# Patient Record
Sex: Female | Born: 1935 | Race: White | Hispanic: No | Smoking: Never smoker
Health system: Southern US, Community
[De-identification: ages and names within clinical notes are randomized; demographics above are authoritative.]

## PROBLEM LIST (undated history)

## (undated) DIAGNOSIS — M1990 Unspecified osteoarthritis, unspecified site: Secondary | ICD-10-CM

## (undated) DIAGNOSIS — I493 Ventricular premature depolarization: Secondary | ICD-10-CM

## (undated) DIAGNOSIS — J42 Unspecified chronic bronchitis: Secondary | ICD-10-CM

## (undated) DIAGNOSIS — E119 Type 2 diabetes mellitus without complications: Secondary | ICD-10-CM

## (undated) DIAGNOSIS — I2510 Atherosclerotic heart disease of native coronary artery without angina pectoris: Secondary | ICD-10-CM

## (undated) DIAGNOSIS — E785 Hyperlipidemia, unspecified: Secondary | ICD-10-CM

## (undated) DIAGNOSIS — I5032 Chronic diastolic (congestive) heart failure: Secondary | ICD-10-CM

## (undated) DIAGNOSIS — R2681 Unsteadiness on feet: Secondary | ICD-10-CM

## (undated) DIAGNOSIS — IMO0001 Reserved for inherently not codable concepts without codable children: Secondary | ICD-10-CM

## (undated) DIAGNOSIS — Z794 Long term (current) use of insulin: Secondary | ICD-10-CM

## (undated) DIAGNOSIS — E876 Hypokalemia: Secondary | ICD-10-CM

## (undated) DIAGNOSIS — D649 Anemia, unspecified: Secondary | ICD-10-CM

## (undated) DIAGNOSIS — I4891 Unspecified atrial fibrillation: Secondary | ICD-10-CM

## (undated) DIAGNOSIS — I10 Essential (primary) hypertension: Secondary | ICD-10-CM

## (undated) DIAGNOSIS — K219 Gastro-esophageal reflux disease without esophagitis: Secondary | ICD-10-CM

## (undated) DIAGNOSIS — M791 Myalgia, unspecified site: Secondary | ICD-10-CM

## (undated) DIAGNOSIS — R54 Age-related physical debility: Secondary | ICD-10-CM

## (undated) DIAGNOSIS — R609 Edema, unspecified: Secondary | ICD-10-CM

## (undated) HISTORY — DX: Myalgia, unspecified site: M79.10

## (undated) HISTORY — PX: CHOLECYSTECTOMY: SHX55

## (undated) HISTORY — PX: CARDIAC CATHETERIZATION: SHX172

## (undated) HISTORY — DX: Type 2 diabetes mellitus without complications: E11.9

## (undated) HISTORY — PX: ABDOMINAL HYSTERECTOMY: SHX81

## (undated) HISTORY — DX: Edema, unspecified: R60.9

## (undated) HISTORY — DX: Essential (primary) hypertension: I10

## (undated) HISTORY — PX: CORONARY ANGIOPLASTY: SHX604

## (undated) HISTORY — DX: Unspecified osteoarthritis, unspecified site: M19.90

## (undated) HISTORY — PX: OTHER SURGICAL HISTORY: SHX169

## (undated) HISTORY — PX: TONSILLECTOMY: SUR1361

## (undated) HISTORY — DX: Gastro-esophageal reflux disease without esophagitis: K21.9

---

## 2009-08-23 ENCOUNTER — Inpatient Hospital Stay (HOSPITAL_COMMUNITY): Admission: RE | Admit: 2009-08-23 | Discharge: 2009-08-27 | Payer: Self-pay | Admitting: Orthopedic Surgery

## 2010-10-09 LAB — URINE MICROSCOPIC-ADD ON

## 2010-10-09 LAB — GLUCOSE, CAPILLARY
Glucose-Capillary: 126 mg/dL — ABNORMAL HIGH (ref 70–99)
Glucose-Capillary: 129 mg/dL — ABNORMAL HIGH (ref 70–99)
Glucose-Capillary: 129 mg/dL — ABNORMAL HIGH (ref 70–99)
Glucose-Capillary: 134 mg/dL — ABNORMAL HIGH (ref 70–99)
Glucose-Capillary: 145 mg/dL — ABNORMAL HIGH (ref 70–99)
Glucose-Capillary: 146 mg/dL — ABNORMAL HIGH (ref 70–99)
Glucose-Capillary: 151 mg/dL — ABNORMAL HIGH (ref 70–99)
Glucose-Capillary: 154 mg/dL — ABNORMAL HIGH (ref 70–99)
Glucose-Capillary: 155 mg/dL — ABNORMAL HIGH (ref 70–99)
Glucose-Capillary: 163 mg/dL — ABNORMAL HIGH (ref 70–99)
Glucose-Capillary: 99 mg/dL (ref 70–99)

## 2010-10-09 LAB — CBC
MCV: 89.6 fL (ref 78.0–100.0)
RBC: 4.37 MIL/uL (ref 3.87–5.11)
RDW: 13.4 % (ref 11.5–15.5)
WBC: 11.4 10*3/uL — ABNORMAL HIGH (ref 4.0–10.5)

## 2010-10-09 LAB — URINALYSIS, ROUTINE W REFLEX MICROSCOPIC
Ketones, ur: NEGATIVE mg/dL
Nitrite: NEGATIVE
pH: 6 (ref 5.0–8.0)

## 2010-10-09 LAB — COMPREHENSIVE METABOLIC PANEL
AST: 25 U/L (ref 0–37)
Albumin: 3.6 g/dL (ref 3.5–5.2)
CO2: 24 mEq/L (ref 19–32)
Calcium: 10.3 mg/dL (ref 8.4–10.5)
Chloride: 107 mEq/L (ref 96–112)
Creatinine, Ser: 0.57 mg/dL (ref 0.4–1.2)
Sodium: 136 mEq/L (ref 135–145)
Total Bilirubin: 0.5 mg/dL (ref 0.3–1.2)
Total Protein: 6.5 g/dL (ref 6.0–8.3)

## 2010-12-11 IMAGING — CR DG CHEST 2V
2 series · 2 of 2 positions shown · non-contrast
Comparison: None available

CLINICAL DATA: Shortness of breath, humeral fracture

CHEST - 2 VIEW

[view not recorded (1 of 2)]
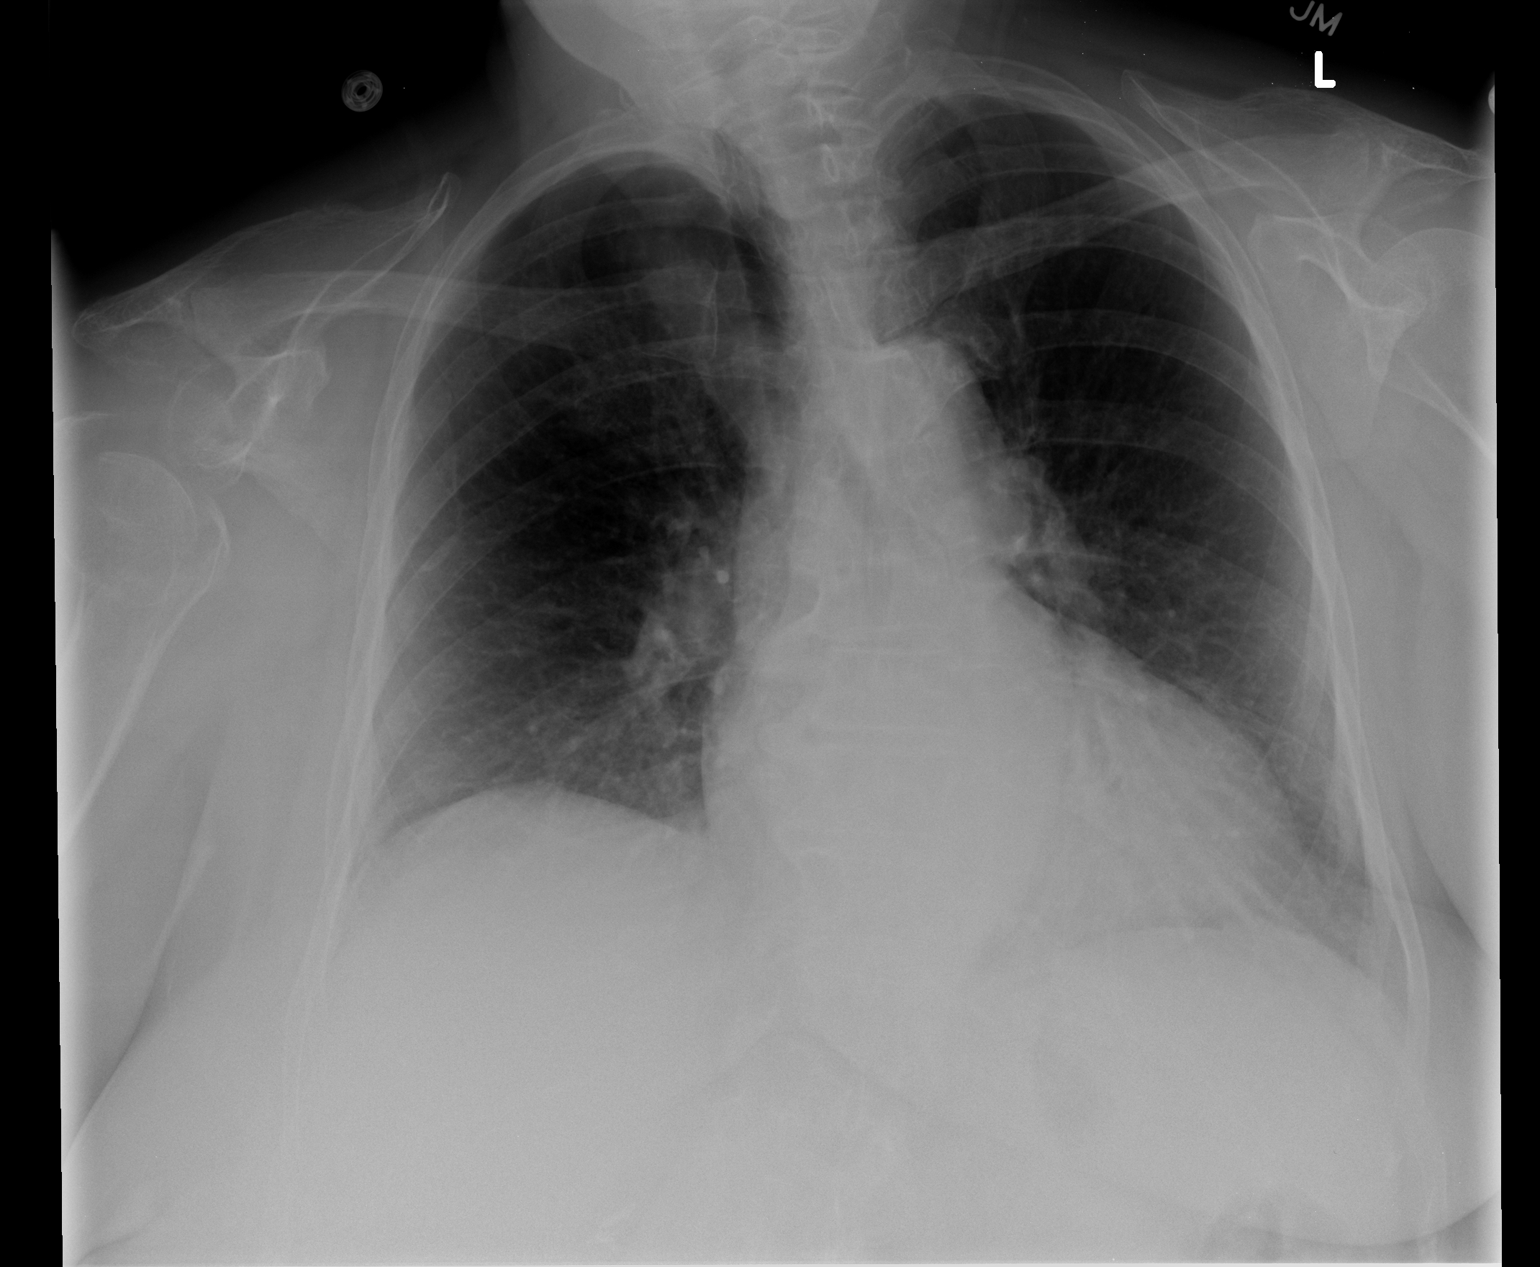

[view not recorded (2 of 2)]
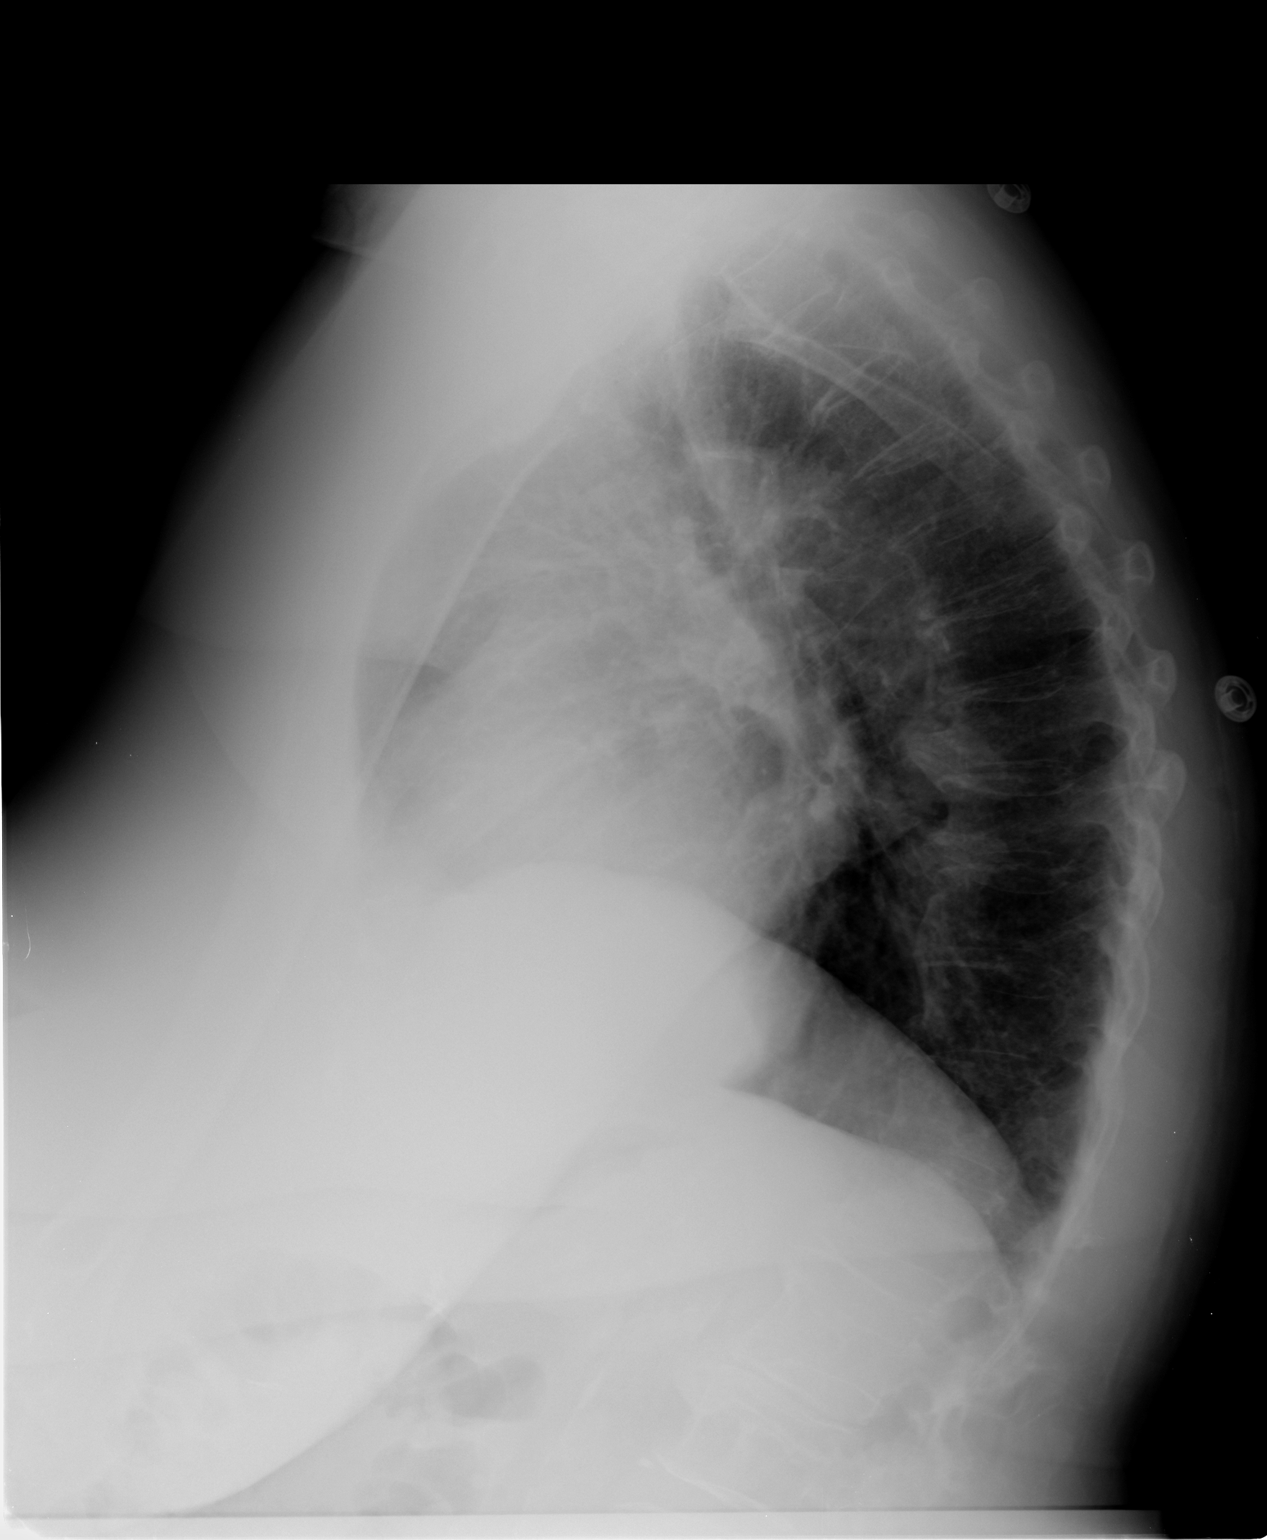

[2 of 2 positions shown; findings below may reference images not displayed]

FINDINGS: Mild cardiomegaly.  Tortuous thoracic aorta.  No
effusion.  Spondylitic changes in the mid thoracic spine.  Lungs
are clear.  Right humeral head fracture with subluxation,
incompletely visualized.
IMPRESSION: 1.  Mild cardiomegaly.

## 2013-09-14 ENCOUNTER — Ambulatory Visit: Payer: Self-pay

## 2013-09-21 ENCOUNTER — Ambulatory Visit (INDEPENDENT_AMBULATORY_CARE_PROVIDER_SITE_OTHER): Payer: Medicare Other

## 2013-09-21 VITALS — BP 145/63 | HR 54 | Resp 16

## 2013-09-21 DIAGNOSIS — M1990 Unspecified osteoarthritis, unspecified site: Secondary | ICD-10-CM

## 2013-09-21 DIAGNOSIS — E1151 Type 2 diabetes mellitus with diabetic peripheral angiopathy without gangrene: Secondary | ICD-10-CM

## 2013-09-21 DIAGNOSIS — M2040 Other hammer toe(s) (acquired), unspecified foot: Secondary | ICD-10-CM

## 2013-09-21 NOTE — Progress Notes (Signed)
   Subjective:    Patient ID: Donna Ballard, female    DOB: 02/19/1931, 80 y.o.   MRN: [id number]  HPI I am a diabetic and have been for about 10 years and I don't think I need my toenails trimmed and I need some new shoes Last pair shoes were ordered nearly 3 years ago she still wearing those there working fairly well however worn need replacing. Patient is no open wounds or ulcerations as a result of wearing her diabetic shoes does have digital contractures history keratoses which are resolved are managed. Patient significant plus one edema nonpalpable pedal pulses at times hyperesthesia.   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  Neurological: Positive for dizziness and headaches.       Burning  Hematological: Bruises/bleeds easily.  All other systems reviewed and are negative.       Objective:   Physical Exam Lower extremity objective findings as follows vascular status is diminished with nonpalpable pulses DP or PT bilateral zero over four temperature is warm to cool turgor diminished there is Refill timed 3-4 seconds all digits there is +1 edema noted bilateral feet and ankles patient is able to walk although has to take breaks in may have some mild early claudication type symptoms. Neurologically epicritic and proprioceptive sensations appear to be intact on Semmes Weinstein testing to forefoot digits and plantar arch. Vibratory sensation intact DTRs not elicited this time. There is normal plantar response noted. Dermatologically skin color pigment normal hair growth absent nails thick brittle criptotic incurvated patient been bring those on her own there is no need for debridement at today's visit no signs of secondary infection no discharge drainage no nail dystrophy noted. There is history keratoses sub-metatarsals result with the use in maintain use of her diabetic insoles and  diabetic shoes. She's had 3 pairs shoes of  over the last several years       Assessment & Plan:  Assessment this time diabetes with peripheral posse minimal neuropathy are significant angiopathy as noted plus one edema severe rigid digital contractures last arthropathy noted with hammertoe deformities HAV deformity these are coming with her current diabetic shoes which are worn need replacing. We'll obtain appropriate authorization from her primary care physician for  Extra-depth shoes and 3 pairs of dual density Plastizote inlays patient to contact with the next month for orthotic scanning or [facility] impressions and measurements for diabetic shoes  Alvan Dameichard Lukasz Rogus DPM

## 2013-09-21 NOTE — Patient Instructions (Signed)
Diabetes and Foot Care Diabetes may cause you to have problems because of poor blood supply (circulation) to your feet and legs. This may cause the skin on your feet to become thinner, break easier, and heal more slowly. Your skin may become dry, and the skin may peel and crack. You may also have nerve damage in your legs and feet causing decreased feeling in them. You may not notice minor injuries to your feet that could lead to infections or more serious problems. Taking care of your feet is one of the most important things you can do for yourself.  HOME CARE INSTRUCTIONS  Wear shoes at all times, even in the house. Do not go barefoot. Bare feet are easily injured.  Check your feet daily for blisters, cuts, and redness. If you cannot see the bottom of your feet, use a mirror or ask someone for help.  Wash your feet with warm water (do not use hot water) and mild soap. Then pat your feet and the areas between your toes until they are completely dry. Do not soak your feet as this can dry your skin.  Apply a moisturizing lotion or petroleum jelly (that does not contain alcohol and is unscented) to the skin on your feet and to dry, brittle toenails. Do not apply lotion between your toes.  Trim your toenails straight across. Do not dig under them or around the cuticle. File the edges of your nails with an emery board or nail file.  Do not cut corns or calluses or try to remove them with medicine.  Wear clean socks or stockings every day. Make sure they are not too tight. Do not wear knee-high stockings since they may decrease blood flow to your legs.  Wear shoes that fit properly and have enough cushioning. To break in new shoes, wear them for just a few hours a day. This prevents you from injuring your feet. Always look in your shoes before you put them on to be sure there are no objects inside.  Do not cross your legs. This may decrease the blood flow to your feet.  If you find a minor scrape,  cut, or break in the skin on your feet, keep it and the skin around it clean and dry. These areas may be cleansed with mild soap and water. Do not cleanse the area with peroxide, alcohol, or iodine.  When you remove an adhesive bandage, be sure not to damage the skin around it.  If you have a wound, look at it several times a day to make sure it is healing.  Do not use heating pads or hot water bottles. They may burn your skin. If you have lost feeling in your feet or legs, you may not know it is happening until it is too late.  Make sure your health care provider performs a complete foot exam at least annually or more often if you have foot problems. Report any cuts, sores, or bruises to your health care provider immediately. SEEK MEDICAL CARE IF:   You have an injury that is not healing.  You have cuts or breaks in the skin.  You have an ingrown nail.  You notice redness on your legs or feet.  You feel burning or tingling in your legs or feet.  You have pain or cramps in your legs and feet.  Your legs or feet are numb.  Your feet always feel cold. SEEK IMMEDIATE MEDICAL CARE IF:   There is increasing redness,   swelling, or pain in or around a wound.  There is a red line that goes up your leg.  Pus is coming from a wound.  You develop a fever or as directed by your health care provider.  You notice a bad smell coming from an ulcer or wound. Document Released: 07/03/2000 Document Revised: 03/08/2013 Document Reviewed: 12/13/2012 [employer] Patient Information 2014 [employer].  

## 2013-10-18 ENCOUNTER — Telehealth: Payer: Self-pay | Admitting: *Deleted

## 2013-10-18 NOTE — Telephone Encounter (Signed)
Brought her there to be fitted for Diabetic Shoes on 09/21/13.  We haven't heard anything.  Is it time to set up an appointment for her to be measured?  She's asking me about it.  Please call.  I called and informed her that her mother does need to be scheduled for an appointment to be measured for Diabetic Shoes.  I sent her to a scheduler for the appointment.

## 2013-10-25 ENCOUNTER — Ambulatory Visit: Payer: Medicare Other

## 2013-11-01 ENCOUNTER — Ambulatory Visit: Payer: Medicare Other

## 2013-11-08 ENCOUNTER — Other Ambulatory Visit: Payer: Medicare Other

## 2013-12-12 ENCOUNTER — Ambulatory Visit (INDEPENDENT_AMBULATORY_CARE_PROVIDER_SITE_OTHER): Payer: Medicare Other | Admitting: *Deleted

## 2013-12-12 DIAGNOSIS — E1159 Type 2 diabetes mellitus with other circulatory complications: Secondary | ICD-10-CM

## 2013-12-12 DIAGNOSIS — I798 Other disorders of arteries, arterioles and capillaries in diseases classified elsewhere: Secondary | ICD-10-CM

## 2013-12-12 DIAGNOSIS — E1151 Type 2 diabetes mellitus with diabetic peripheral angiopathy without gangrene: Secondary | ICD-10-CM

## 2013-12-12 NOTE — Progress Notes (Signed)
I am here to get measured for diabetic shoes

## 2014-02-28 ENCOUNTER — Ambulatory Visit (INDEPENDENT_AMBULATORY_CARE_PROVIDER_SITE_OTHER): Payer: Medicare Other

## 2014-02-28 VITALS — BP 159/59 | HR 59 | Resp 18

## 2014-02-28 DIAGNOSIS — E1159 Type 2 diabetes mellitus with other circulatory complications: Secondary | ICD-10-CM

## 2014-02-28 DIAGNOSIS — E1151 Type 2 diabetes mellitus with diabetic peripheral angiopathy without gangrene: Secondary | ICD-10-CM

## 2014-02-28 DIAGNOSIS — M19079 Primary osteoarthritis, unspecified ankle and foot: Secondary | ICD-10-CM

## 2014-02-28 DIAGNOSIS — M2040 Other hammer toe(s) (acquired), unspecified foot: Secondary | ICD-10-CM

## 2014-02-28 NOTE — Patient Instructions (Signed)
Diabetes and Foot Care Diabetes may cause you to have problems because of poor blood supply (circulation) to your feet and legs. This may cause the skin on your feet to become thinner, break easier, and heal more slowly. Your skin may become dry, and the skin may peel and crack. You may also have nerve damage in your legs and feet causing decreased feeling in them. You may not notice minor injuries to your feet that could lead to infections or more serious problems. Taking care of your feet is one of the most important things you can do for yourself.  HOME CARE INSTRUCTIONS  Wear shoes at all times, even in the house. Do not go barefoot. Bare feet are easily injured.  Check your feet daily for blisters, cuts, and redness. If you cannot see the bottom of your feet, use a mirror or ask someone for help.  Wash your feet with warm water (do not use hot water) and mild soap. Then pat your feet and the areas between your toes until they are completely dry. Do not soak your feet as this can dry your skin.  Apply a moisturizing lotion or petroleum jelly (that does not contain alcohol and is unscented) to the skin on your feet and to dry, brittle toenails. Do not apply lotion between your toes.  Trim your toenails straight across. Do not dig under them or around the cuticle. File the edges of your nails with an emery board or nail file.  Do not cut corns or calluses or try to remove them with medicine.  Wear clean socks or stockings every day. Make sure they are not too tight. Do not wear knee-high stockings since they may decrease blood flow to your legs.  Wear shoes that fit properly and have enough cushioning. To break in new shoes, wear them for just a few hours a day. This prevents you from injuring your feet. Always look in your shoes before you put them on to be sure there are no objects inside.  Do not cross your legs. This may decrease the blood flow to your feet.  If you find a minor scrape,  cut, or break in the skin on your feet, keep it and the skin around it clean and dry. These areas may be cleansed with mild soap and water. Do not cleanse the area with peroxide, alcohol, or iodine.  When you remove an adhesive bandage, be sure not to damage the skin around it.  If you have a wound, look at it several times a day to make sure it is healing.  Do not use heating pads or hot water bottles. They may burn your skin. If you have lost feeling in your feet or legs, you may not know it is happening until it is too late.  Make sure your health care provider performs a complete foot exam at least annually or more often if you have foot problems. Report any cuts, sores, or bruises to your health care provider immediately. SEEK MEDICAL CARE IF:   You have an injury that is not healing.  You have cuts or breaks in the skin.  You have an ingrown nail.  You notice redness on your legs or feet.  You feel burning or tingling in your legs or feet.  You have pain or cramps in your legs and feet.  Your legs or feet are numb.  Your feet always feel cold. SEEK IMMEDIATE MEDICAL CARE IF:   There is increasing redness,   swelling, or pain in or around a wound.  There is a red line that goes up your leg.  Pus is coming from a wound.  You develop a fever or as directed by your health care provider.  You notice a bad smell coming from an ulcer or wound. Document Released: 07/03/2000 Document Revised: 03/08/2013 Document Reviewed: 12/13/2012 [employer] Patient Information 2015 [employer]. This information is not intended to replace advice given to you by your health care provider. Make sure you discuss any questions you have with your health care provider.  

## 2014-02-28 NOTE — Progress Notes (Signed)
Subjective:     Patient ID: Donna Ballard, female   DOB: 09/08/1930, 80 y.o.   MRN: [id number]  HPI i am here to get my diabetic shoes   Review of Systems no new findings or systemic changes     Objective:   Physical Exam Alternate objective findings as follows pedal pulses palpable DP plus one over 4 PT plus one over 4 capillary refill time 3 seconds epicritic and proprioceptive sensations diminished on Semmes Weinstein testing to forefoot and digits. There is normal plantar response DTRs not listed neurologically skin color pigment normal there is keratoses diffuse secondary digital contractures mild arthropathy the foot with gait abnormalities patient's shoes are replaced at this time 1 pair shoes and 3 pairs of dual density Plastizote inlays which fit and contour well to the patient's foot are dispensed with use in break in instructions.    Assessment:     Assessment this time is diabetes with history peripheral neuropathy well-managed at this time does have history of diabetes and complications keratoses and digital contractures had wearing accommodative diabetic shoes inlays preventing breakdown and complicating are placed in shoes are furnished at this time.    Plan:     Plan at this time diabetic shoes are dispensed with break in were instructions followup in 1 year for annual checkup and reevaluation contact us in the interim if there's any exacerbations knee difficulties pain or discomfort or any open wounds or infections were develop next  Alvan Dameichard Avrohom Mckelvin DPM

## 2015-03-20 DIAGNOSIS — I10 Essential (primary) hypertension: Secondary | ICD-10-CM

## 2015-03-20 HISTORY — DX: Essential (primary) hypertension: I10

## 2015-11-04 DIAGNOSIS — Z6841 Body Mass Index (BMI) 40.0 and over, adult: Secondary | ICD-10-CM

## 2015-11-04 DIAGNOSIS — Z79899 Other long term (current) drug therapy: Secondary | ICD-10-CM | POA: Insufficient documentation

## 2015-11-04 HISTORY — DX: Other long term (current) drug therapy: Z79.899

## 2015-11-04 HISTORY — DX: Body Mass Index (BMI) 40.0 and over, adult: Z684

## 2016-03-02 DIAGNOSIS — Z6841 Body Mass Index (BMI) 40.0 and over, adult: Secondary | ICD-10-CM | POA: Insufficient documentation

## 2016-03-02 HISTORY — DX: Morbid (severe) obesity due to excess calories: E66.01

## 2016-03-02 HISTORY — DX: Body Mass Index (BMI) 40.0 and over, adult: Z684

## 2018-04-29 DIAGNOSIS — K2960 Other gastritis without bleeding: Secondary | ICD-10-CM | POA: Insufficient documentation

## 2018-04-29 HISTORY — DX: Other gastritis without bleeding: K29.60

## 2018-06-12 ENCOUNTER — Inpatient Hospital Stay (HOSPITAL_COMMUNITY)
Admission: AD | Admit: 2018-06-12 | Discharge: 2018-06-15 | DRG: 246 | Disposition: A | Discharge status: Skilled Nursing Facility | Payer: Medicare Other | Source: Other Acute Inpatient Hospital | Attending: Internal Medicine | Admitting: Internal Medicine

## 2018-06-12 ENCOUNTER — Inpatient Hospital Stay (HOSPITAL_COMMUNITY): Payer: Medicare Other

## 2018-06-12 ENCOUNTER — Encounter (HOSPITAL_COMMUNITY): Payer: Self-pay | Admitting: Internal Medicine

## 2018-06-12 DIAGNOSIS — I249 Acute ischemic heart disease, unspecified: Secondary | ICD-10-CM | POA: Diagnosis not present

## 2018-06-12 DIAGNOSIS — Z7982 Long term (current) use of aspirin: Secondary | ICD-10-CM | POA: Diagnosis not present

## 2018-06-12 DIAGNOSIS — I5032 Chronic diastolic (congestive) heart failure: Secondary | ICD-10-CM

## 2018-06-12 DIAGNOSIS — R269 Unspecified abnormalities of gait and mobility: Secondary | ICD-10-CM | POA: Diagnosis present

## 2018-06-12 DIAGNOSIS — E669 Obesity, unspecified: Secondary | ICD-10-CM | POA: Diagnosis present

## 2018-06-12 DIAGNOSIS — R7989 Other specified abnormal findings of blood chemistry: Secondary | ICD-10-CM

## 2018-06-12 DIAGNOSIS — E785 Hyperlipidemia, unspecified: Secondary | ICD-10-CM | POA: Diagnosis not present

## 2018-06-12 DIAGNOSIS — Z7901 Long term (current) use of anticoagulants: Secondary | ICD-10-CM

## 2018-06-12 DIAGNOSIS — S0083XA Contusion of other part of head, initial encounter: Secondary | ICD-10-CM | POA: Diagnosis not present

## 2018-06-12 DIAGNOSIS — D72829 Elevated white blood cell count, unspecified: Secondary | ICD-10-CM

## 2018-06-12 DIAGNOSIS — E876 Hypokalemia: Secondary | ICD-10-CM

## 2018-06-12 DIAGNOSIS — I4891 Unspecified atrial fibrillation: Principal | ICD-10-CM | POA: Diagnosis present

## 2018-06-12 DIAGNOSIS — J9601 Acute respiratory failure with hypoxia: Secondary | ICD-10-CM

## 2018-06-12 DIAGNOSIS — I5033 Acute on chronic diastolic (congestive) heart failure: Secondary | ICD-10-CM | POA: Diagnosis present

## 2018-06-12 DIAGNOSIS — Z6841 Body Mass Index (BMI) 40.0 and over, adult: Secondary | ICD-10-CM | POA: Diagnosis not present

## 2018-06-12 DIAGNOSIS — E119 Type 2 diabetes mellitus without complications: Secondary | ICD-10-CM | POA: Diagnosis present

## 2018-06-12 DIAGNOSIS — I119 Hypertensive heart disease without heart failure: Secondary | ICD-10-CM

## 2018-06-12 DIAGNOSIS — I509 Heart failure, unspecified: Secondary | ICD-10-CM

## 2018-06-12 DIAGNOSIS — R54 Age-related physical debility: Secondary | ICD-10-CM | POA: Diagnosis present

## 2018-06-12 DIAGNOSIS — R0602 Shortness of breath: Secondary | ICD-10-CM

## 2018-06-12 DIAGNOSIS — I110 Hypertensive heart disease with heart failure: Secondary | ICD-10-CM | POA: Diagnosis present

## 2018-06-12 DIAGNOSIS — I5031 Acute diastolic (congestive) heart failure: Secondary | ICD-10-CM | POA: Diagnosis not present

## 2018-06-12 DIAGNOSIS — D649 Anemia, unspecified: Secondary | ICD-10-CM | POA: Diagnosis not present

## 2018-06-12 DIAGNOSIS — W19XXXA Unspecified fall, initial encounter: Secondary | ICD-10-CM | POA: Diagnosis not present

## 2018-06-12 DIAGNOSIS — M25551 Pain in right hip: Secondary | ICD-10-CM | POA: Diagnosis not present

## 2018-06-12 DIAGNOSIS — I2510 Atherosclerotic heart disease of native coronary artery without angina pectoris: Secondary | ICD-10-CM | POA: Diagnosis present

## 2018-06-12 DIAGNOSIS — I370 Nonrheumatic pulmonary valve stenosis: Secondary | ICD-10-CM | POA: Diagnosis not present

## 2018-06-12 DIAGNOSIS — Z96611 Presence of right artificial shoulder joint: Secondary | ICD-10-CM | POA: Diagnosis present

## 2018-06-12 DIAGNOSIS — I214 Non-ST elevation (NSTEMI) myocardial infarction: Secondary | ICD-10-CM | POA: Diagnosis not present

## 2018-06-12 DIAGNOSIS — R55 Syncope and collapse: Secondary | ICD-10-CM | POA: Diagnosis present

## 2018-06-12 DIAGNOSIS — R2681 Unsteadiness on feet: Secondary | ICD-10-CM

## 2018-06-12 DIAGNOSIS — Z888 Allergy status to other drugs, medicaments and biological substances status: Secondary | ICD-10-CM

## 2018-06-12 DIAGNOSIS — I4819 Other persistent atrial fibrillation: Secondary | ICD-10-CM

## 2018-06-12 DIAGNOSIS — I10 Essential (primary) hypertension: Secondary | ICD-10-CM

## 2018-06-12 DIAGNOSIS — I25110 Atherosclerotic heart disease of native coronary artery with unstable angina pectoris: Secondary | ICD-10-CM

## 2018-06-12 HISTORY — DX: Essential (primary) hypertension: I10

## 2018-06-12 HISTORY — DX: Ventricular premature depolarization: I49.3

## 2018-06-12 HISTORY — DX: Reserved for inherently not codable concepts without codable children: IMO0001

## 2018-06-12 HISTORY — DX: Unspecified chronic bronchitis: J42

## 2018-06-12 HISTORY — DX: Unsteadiness on feet: R26.81

## 2018-06-12 HISTORY — DX: Long term (current) use of insulin: Z79.4

## 2018-06-12 HISTORY — DX: Unspecified osteoarthritis, unspecified site: M19.90

## 2018-06-12 HISTORY — DX: Atherosclerotic heart disease of native coronary artery without angina pectoris: I25.10

## 2018-06-12 HISTORY — DX: Hypokalemia: E87.6

## 2018-06-12 HISTORY — DX: Type 2 diabetes mellitus without complications: E11.9

## 2018-06-12 HISTORY — DX: Anemia, unspecified: D64.9

## 2018-06-12 HISTORY — DX: Unspecified atrial fibrillation: I48.91

## 2018-06-12 HISTORY — DX: Chronic diastolic (congestive) heart failure: I50.32

## 2018-06-12 HISTORY — DX: Age-related physical debility: R54

## 2018-06-12 HISTORY — DX: Non-ST elevation (NSTEMI) myocardial infarction: I21.4

## 2018-06-12 HISTORY — DX: Hyperlipidemia, unspecified: E78.5

## 2018-06-12 LAB — APTT: aPTT: 55 seconds — ABNORMAL HIGH (ref 24–36)

## 2018-06-12 LAB — CBC
HCT: 36.8 % (ref 36.0–46.0)
Hemoglobin: 11.2 g/dL — ABNORMAL LOW (ref 12.0–15.0)
MCH: 26.5 pg (ref 26.0–34.0)
MCHC: 30.4 g/dL (ref 30.0–36.0)
MCV: 87 fL (ref 80.0–100.0)
Platelets: 292 10*3/uL (ref 150–400)
RBC: 4.23 MIL/uL (ref 3.87–5.11)
RDW: 14.6 % (ref 11.5–15.5)
WBC: 19.5 10*3/uL — AB (ref 4.0–10.5)
nRBC: 0 % (ref 0.0–0.2)

## 2018-06-12 LAB — PROTIME-INR
INR: 1.17
PROTHROMBIN TIME: 14.8 s (ref 11.4–15.2)

## 2018-06-12 LAB — MRSA PCR SCREENING: MRSA by PCR: NEGATIVE

## 2018-06-12 LAB — GLUCOSE, CAPILLARY: GLUCOSE-CAPILLARY: 144 mg/dL — AB (ref 70–99)

## 2018-06-12 MED ORDER — INSULIN GLARGINE 100 UNIT/ML ~~LOC~~ SOLN
20.0000 [IU] | Freq: Every day | SUBCUTANEOUS | Status: DC
  Administered 2018-06-13 – 2018-06-15 (×3): 20 [IU] via SUBCUTANEOUS
  Filled 2018-06-12 (×3): qty 0.2

## 2018-06-12 MED ORDER — ADULT MULTIVITAMIN W/MINERALS CH
1.0000 | ORAL_TABLET | Freq: Every day | ORAL | Status: DC
  Administered 2018-06-13 – 2018-06-15 (×3): 1 via ORAL
  Filled 2018-06-12 (×3): qty 1

## 2018-06-12 MED ORDER — FUROSEMIDE 10 MG/ML IJ SOLN
20.0000 mg | Freq: Once | INTRAMUSCULAR | Status: AC
  Administered 2018-06-12: 20 mg via INTRAVENOUS
  Filled 2018-06-12: qty 2

## 2018-06-12 MED ORDER — ASPIRIN 81 MG PO CHEW
81.0000 mg | CHEWABLE_TABLET | Freq: Every day | ORAL | Status: DC
  Administered 2018-06-13 – 2018-06-14 (×2): 81 mg via ORAL
  Filled 2018-06-12 (×2): qty 1

## 2018-06-12 MED ORDER — ATORVASTATIN CALCIUM 40 MG PO TABS
40.0000 mg | ORAL_TABLET | Freq: Every day | ORAL | Status: DC
  Administered 2018-06-14: 40 mg via ORAL
  Filled 2018-06-12: qty 1

## 2018-06-12 MED ORDER — ALBUTEROL SULFATE (2.5 MG/3ML) 0.083% IN NEBU: 3.0000 mL | INHALATION_SOLUTION | Freq: Four times a day (QID) | RESPIRATORY_TRACT | Status: DC | PRN

## 2018-06-12 MED ORDER — TRAMADOL HCL 50 MG PO TABS
50.0000 mg | ORAL_TABLET | Freq: Two times a day (BID) | ORAL | Status: DC | PRN
  Administered 2018-06-12 – 2018-06-13 (×2): 50 mg via ORAL
  Filled 2018-06-12 (×2): qty 1

## 2018-06-12 MED ORDER — POLYETHYLENE GLYCOL 3350 17 G PO PACK
17.0000 g | PACK | Freq: Every day | ORAL | Status: DC
  Administered 2018-06-13 – 2018-06-15 (×2): 17 g via ORAL
  Filled 2018-06-12 (×3): qty 1

## 2018-06-12 MED ORDER — HEPARIN (PORCINE) 25000 UT/250ML-% IV SOLN
1150.0000 [IU]/h | INTRAVENOUS | Status: DC
  Administered 2018-06-12: 1000 [IU]/h via INTRAVENOUS

## 2018-06-12 MED ORDER — FUROSEMIDE 10 MG/ML IJ SOLN
20.0000 mg | Freq: Once | INTRAMUSCULAR | Status: AC
  Administered 2018-06-13: 20 mg via INTRAVENOUS
  Filled 2018-06-12: qty 2

## 2018-06-12 MED ORDER — NITROGLYCERIN 0.4 MG SL SUBL: 0.4000 mg | SUBLINGUAL_TABLET | SUBLINGUAL | Status: DC | PRN

## 2018-06-12 MED ORDER — ACETAMINOPHEN 325 MG PO TABS
650.0000 mg | ORAL_TABLET | ORAL | Status: DC | PRN
  Administered 2018-06-14: 650 mg via ORAL
  Filled 2018-06-12: qty 2

## 2018-06-12 MED ORDER — CARVEDILOL 25 MG PO TABS
25.0000 mg | ORAL_TABLET | Freq: Two times a day (BID) | ORAL | Status: DC
  Administered 2018-06-13 – 2018-06-15 (×4): 25 mg via ORAL
  Filled 2018-06-12 (×4): qty 1

## 2018-06-12 MED ORDER — FOLIC ACID 1 MG PO TABS
500.0000 ug | ORAL_TABLET | Freq: Every day | ORAL | Status: DC
  Administered 2018-06-13 – 2018-06-15 (×3): 0.5 mg via ORAL
  Filled 2018-06-12 (×3): qty 1

## 2018-06-12 MED ORDER — ORAL CARE MOUTH RINSE
15.0000 mL | Freq: Two times a day (BID) | OROMUCOSAL | Status: DC
  Administered 2018-06-12 – 2018-06-15 (×6): 15 mL via OROMUCOSAL

## 2018-06-12 MED ORDER — FAMOTIDINE 20 MG PO TABS
20.0000 mg | ORAL_TABLET | Freq: Every day | ORAL | Status: DC
  Administered 2018-06-13 – 2018-06-15 (×3): 20 mg via ORAL
  Filled 2018-06-12 (×3): qty 1

## 2018-06-12 MED ORDER — POLYVINYL ALCOHOL 1.4 % OP SOLN
1.0000 [drp] | Freq: Two times a day (BID) | OPHTHALMIC | Status: DC
  Administered 2018-06-13 – 2018-06-15 (×6): 1 [drp] via OPHTHALMIC
  Filled 2018-06-12: qty 15

## 2018-06-12 MED ORDER — ONDANSETRON HCL 4 MG/2ML IJ SOLN: 4.0000 mg | Freq: Four times a day (QID) | INTRAMUSCULAR | Status: DC | PRN

## 2018-06-12 MED ORDER — VITAMIN B-12 100 MCG PO TABS
500.0000 ug | ORAL_TABLET | Freq: Every day | ORAL | Status: DC
  Administered 2018-06-13 – 2018-06-15 (×3): 500 ug via ORAL
  Filled 2018-06-12 (×2): qty 1
  Filled 2018-06-12: qty 5

## 2018-06-12 NOTE — Progress Notes (Signed)
ANTICOAGULATION CONSULT NOTE - Initial Consult  Pharmacy Consult for Heparin  Indication: atrial fibrillation, NSTEMI  Allergies  Allergen Reactions  . Amlodipine Shortness Of Breath  . Metformin And Related Shortness Of Breath  . Celecoxib Diarrhea  . Enalapril Maleate Other (See Comments)    Myalgias   . Estrogens Other (See Comments)    Edema  . Furosemide     Myalgia   . Hydrochlorothiazide Other (See Comments)    Hypercalcemia  . Irbesartan Other (See Comments)    Leg cramps   . Pantoprazole Other (See Comments)    Arthralgia   . Trandolapril Other (See Comments)    Insomnia   . Valsartan Other (See Comments)    Myalgia, rash, insomnia    Patient Measurements: ~96 kg  Vital Signs: BP: 136/80 (11/24 2300) Pulse Rate: 94 (11/24 2300)   Medical History: Past Medical History:  Diagnosis Date  . CHF (congestive heart failure) (HCC)   . Chronic bronchitis (HCC)   . HLD (hyperlipidemia)   . HTN (hypertension)   . Insulin dependent diabetes mellitus (HCC)   . Osteoarthritis    Assessment: 84 y/o F [facility] with NSTEMI and new onset atrial fibrillation. Likely cath 11/25. Pt arrived [facility] with heparin infusing at 1000 units/hr. Hgb there was 10.6.   Goal of Therapy:  Heparin level 0.3-0.7 units/ml Monitor platelets by anticoagulation protocol: Yes   Plan:  -Cont heparin at 1000 units/hr -Check heparin level with AM labs  Abran DukeLedford, Diella Gillingham 06/12/2018,11:40 PM

## 2018-06-12 NOTE — H&P (Signed)
CARDIOLOGY H&P  HPI: Donna Ballard is a 84 y.o. female w/ history of obesity, HTN, DM who presents with presycnope and fall.   In brief, the patient lives by herself and has had a longstanding history of difficulty with her balance requiring a walker.  She has had multiple episodes in the past where she has become dizzy and almost fallen, however has never overtly fallen and injured herself.  She states that she was at home yesterday and got out of bed to go to the bathroom.  After standing up from using the bathroom she became very dizzy and lightheaded.  She attempted to reach her walker but was unable to do so and ended up falling onto the ground.  She does not believe that she struck her head or significantly injured any other part of her body in the fall.  She vehemently denies any prodromal symptoms of chest pain, chest pressure, acute shortness of breath, palpitations before her fall.  EMS was called and found the patient to be hypoxemic at home.  She was brought to an outside hospital for further evaluation.  On arrival to the outside hospital, the patient was started on BiPAP for her hypoxemia.  She was found to be in rapid atrial fibrillation, which was a new diagnosis for her at the time.  She was found to have a white blood cell count of 13.7, potassium 2.4, and calcium of 6.2.  Her BNP was elevated and 1930.  Her initial troponin was negative.  She was given 80 mg of IV Lasix and admitted for further management.  She put out over 2 L of urine in response to 1 dose of IV Lasix.  She was weaned off of BiPAP and placed on nasal cannula.  Her second troponin came back elevated at 0.57.  She was started on heparin for acute coronary syndrome and transferred to [facility] for further evaluation.  Prior to transfer, the patient underwent chest CT scan, which revealed nonspecific groundglass opacities in the bilateral lungs suggestive of pulmonary edema and at least moderate coronary and  aortic atherosclerosis.  She further was found to have small bilateral pleural effusions and bibasilar atelectasis.  A head CT was performed and revealed no evidence of acute intracranial abnormality.  She was found to have a small forehead hematoma without fracture.  A right hip x-ray was performed as well which showed advanced lumbar degenerative spondylosis with no gross displaced fracture or misalignment.  On arrival to [facility], the patient denies any new symptoms.  She predominantly describes symptoms of exertional shortness of breath and orthopnea that are been going on over the past several weeks.  She is in atrial fibrillation but she does not feel any palpitations, chest pressure, or chest pain.  She has had worsening bilateral extremity edema over the past several weeks as well.   Review of Systems:     Cardiac Review of Systems: {Y] = yes [ ]  = no  Chest Pain [    ]  Resting SOB [   ] Exertional SOB  [Y]  Orthopnea [Y]   Pedal Edema [Y]    Palpitations [Y] Syncope  [  ]   Presyncope [Y]  General Review of Systems: [Y] = yes [  ]=no Constitional: recent weight change [  ]; anorexia [  ]; fatigue [  ]; nausea [  ]; night sweats [  ]; fever [  ]; or chills [  ];  Dental: poor dentition[  ];   Eye : blurred vision [  ]; diplopia [   ]; vision changes [  ];  Amaurosis fugax[  ]; Resp: cough [  ];  wheezing[  ];  hemoptysis[  ]; shortness of breath[Y]; paroxysmal nocturnal dyspnea[Y]; dyspnea on exertion[Y]; or orthopnea[Y];  GI:  gallstones[  ], vomiting[  ];  dysphagia[  ]; melena[  ];  hematochezia [  ]; heartburn[  ];   GU: kidney stones [  ]; hematuria[  ];   dysuria [  ];  nocturia[  ];               Skin: rash [Y], swelling[  ];, hair loss[  ];  peripheral edema[Y];  or itching[  ]; Musculosketetal: myalgias[Y];  joint swelling[  ];  joint erythema[  ];  joint pain[Y];  back pain[  ];  Heme/Lymph:  bruising[Y];  bleeding[  ];  anemia[  ];  Neuro: TIA[  ];  headaches[  ];  stroke[  ];  vertigo[  ];  seizures[  ];   paresthesias[  ];  difficulty walking[  ];  Psych:depression[  ]; anxiety[  ];  Endocrine: diabetes[Y];  thyroid dysfunction[  ];  Other:  No past medical history on file.  Prior to Admission medications   Medication Sig Start Date End Date Taking? Authorizing Provider  albuterol (PROVENTIL HFA;VENTOLIN HFA) 108 (90 Base) MCG/ACT inhaler Inhale 2 puffs into the lungs every 6 (six) hours as needed for wheezing or shortness of breath.   Yes [provider]  aspirin 81 MG chewable tablet Chew 81 mg by mouth daily.   Yes [provider]  carvedilol (COREG) 25 MG tablet Take 25 mg by mouth 2 (two) times daily with a meal.   Yes [provider]  ergocalciferol (VITAMIN D2) 1.25 MG (50000 UT) capsule Take 50,000 Units by mouth once a week.   Yes [provider]  folic acid (FOLVITE) 800 MCG tablet Take 400 mcg by mouth daily.   Yes [provider]  insulin glargine (LANTUS) 100 UNIT/ML injection Inject 20 Units into the skin daily.    Yes [provider]  Multiple Vitamin (MULTIVITAMIN) capsule Take 1 capsule by mouth daily.   Yes [provider]  nitroGLYCERIN (NITROSTAT) 0.4 MG SL tablet Place 0.4 mg under the tongue every 5 (five) minutes as needed for chest pain.   Yes [provider]  Omega-3 Fatty Acids (FISH OIL) 1200 MG CAPS Take 1 capsule by mouth daily.   Yes [provider]  polyethylene glycol (MIRALAX / GLYCOLAX) packet Take 17 g by mouth daily.   Yes [provider]  Propylene Glycol (SYSTANE BALANCE OP) Apply 1 drop to eye 2 (two) times daily.   Yes [provider]  ranitidine (ZANTAC) 150 MG tablet Take 150 mg by mouth 2 (two) times daily.   Yes [provider]  torsemide (DEMADEX) 20 MG tablet Take 20 mg by mouth daily.   Yes [provider]  traMADol  (ULTRAM) 50 MG tablet Take 50 mg by mouth 2 (two) times daily as needed.   Yes [provider]  vitamin B-12 (CYANOCOBALAMIN) 500 MCG tablet Take 500 mcg by mouth daily.   Yes [provider]      Allergies not on file  Social History   Socioeconomic History  . Marital status: Widowed    Spouse name: Not on file  . Number of children: Not on file  . Years  of education: Not on file  . Highest education level: Not on file  Occupational History  . Not on file  Social Needs  . Financial resource strain: Not on file  . Food insecurity:    Worry: Not on file    Inability: Not on file  . Transportation needs:    Medical: Not on file    Non-medical: Not on file  Tobacco Use  . Smoking status: Not on file  Substance and Sexual Activity  . Alcohol use: Not on file  . Drug use: Not on file  . Sexual activity: Not on file  Lifestyle  . Physical activity:    Days per week: Not on file    Minutes per session: Not on file  . Stress: Not on file  Relationships  . Social connections:    Talks on phone: Not on file    Gets together: Not on file    Attends religious service: Not on file    Active member of club or organization: Not on file    Attends meetings of clubs or organizations: Not on file    Relationship status: Not on file  . Intimate partner violence:    Fear of current or ex partner: Not on file    Emotionally abused: Not on file    Physically abused: Not on file    Forced sexual activity: Not on file  Other Topics Concern  . Not on file  Social History Narrative  . Not on file    No family history on file.  PHYSICAL EXAM: Vitals:   06/12/18 2137 06/12/18 2145  BP: (!) 151/68 (!) 152/78  Pulse: 92 98  Resp: (!) 21 20  SpO2: 97% 98%   General: Frail-appearing, pleasant, multiple areas of bruising, no acute distress HEENT: Bruising throughout the forehead and bilateral periorbital areas with some swelling over the forehead as well Neck:  supple.  JVP elevated to approximately 10 cm water. Carotids 2+ bilat; no bruits. No lymphadenopathy or thryomegaly appreciated. Cor: PMI nondisplaced.  Irregularly irregular rhythm. No rubs, gallops or murmurs.  Distant heart sounds Lungs: Crackles heard most predominantly at the left lung base, decreased air movement throughout Abdomen: soft, nontender, nondistended. No hepatosplenomegaly. No bruits or masses. Good bowel sounds. Extremities: no cyanosis, clubbing, rash, 2+ pitting edema of the bilateral lower extremities extending proximal to the knee and symmetric Neuro: alert & oriented x 3, cranial nerves grossly intact. moves all 4 extremities w/o difficulty. Affect pleasant.  ECG: Heart rate 84 bpm in atrial fibrillation, right bundle branch block, repolarization abnormality  Results for orders placed or performed during the hospital encounter of 06/12/18 (from the past 24 hour(s))  Glucose, capillary     Status: Abnormal   Collection Time: 06/12/18  9:45 PM  Result Value Ref Range   Glucose-Capillary 144 (H) 70 - 99 mg/dL   No results found.  ASSESSMENT: Donna Ballard is a 84 y.o. female w/ history of obesity, HTN, DM who presents with presycnope and fall, found to have new atrial fibrillation with RVR and non-STEMI.  [facility] outside hospital for coronary angiography.  Regarding the patient's non-STEMI, she has multiple risk factors for coronary artery disease.  Whether this was a type I event secondary to unstable plaque rupture or a demand mediated event in the setting of physiologic stress and new atrial fibrillation is unclear.  We will proceed with invasive coronary angiography tomorrow.  The patient also appears to be  in decompensated heart failure, which is a new diagnosis for her.  On some of her past records there is a diagnosis of heart failure listed, however no further information about this is available.  It is certainly possible that  her new onset atrial fibrillation has provoked clinically decompensated heart failure.  We currently do not have an assessment of her LV systolic function.  We will treat with IV diuretics overnight tonight and plan to assess her LV function with echocardiogram tomorrow.  She will need medical optimization if she is found to have reduced ejection fraction heart failure.  PLAN/DISCUSSION: #) NSTEMI - repeat troponin q6h x 2 - NPO for cath in AM - check lipids, A1c - ASA 81mg  daily - heparin drip for AF per pharmacy protocol - atorvastatin 40mg  QHS - SLN, nitro gtt PRN - defer P2Y12 until after cath  #) New onset AF - heparin drip as per above - Likely transition to oral anticoagulant tomorrow - May be worth considering electrical cardioversion in attempt to restore sinus rhythm pending the remainder of the patient's work-up; patient would notably need TEE prior to cardioversion attempt  #) Decompensated heart failure: Patient has responded robustly to low-dose IV Lasix boluses - 20 mg IV Lasix tonight and tomorrow morning - Patient does not appear to be too far off from euvolemia, may be ready to transition to oral diuretic in the next 1 to 2 days - Echocardiogram as mentioned above - Will need medical optimization for treatment of heart failure pending results from echocardiogram  #) Falls, frailty - physical therapy consult - Patient will likely need inpatient rehabilitation once she is medically cleared for discharge  Rosario Jacks, MD Cardiology Fellow, PGY-6

## 2018-06-13 ENCOUNTER — Other Ambulatory Visit: Payer: Self-pay

## 2018-06-13 ENCOUNTER — Encounter (HOSPITAL_COMMUNITY)
Admission: AD | Disposition: A | Discharge status: Skilled Nursing Facility | Payer: Self-pay | Source: Other Acute Inpatient Hospital | Attending: Internal Medicine

## 2018-06-13 DIAGNOSIS — I214 Non-ST elevation (NSTEMI) myocardial infarction: Secondary | ICD-10-CM

## 2018-06-13 DIAGNOSIS — I2510 Atherosclerotic heart disease of native coronary artery without angina pectoris: Secondary | ICD-10-CM

## 2018-06-13 DIAGNOSIS — I4819 Other persistent atrial fibrillation: Secondary | ICD-10-CM

## 2018-06-13 HISTORY — PX: CORONARY STENT INTERVENTION: CATH118234

## 2018-06-13 HISTORY — PX: LEFT HEART CATH AND CORONARY ANGIOGRAPHY: CATH118249

## 2018-06-13 LAB — BASIC METABOLIC PANEL
ANION GAP: 12 (ref 5–15)
BUN: 14 mg/dL (ref 8–23)
CHLORIDE: 103 mmol/L (ref 98–111)
CO2: 25 mmol/L (ref 22–32)
Calcium: 9 mg/dL (ref 8.9–10.3)
Creatinine, Ser: 0.89 mg/dL (ref 0.44–1.00)
GFR calc Af Amer: >60 mL/min (ref >60)
GFR calc non Af Amer: 57 mL/min — ABNORMAL LOW (ref >60)
Glucose, Bld: 205 mg/dL — ABNORMAL HIGH (ref 70–99)
Potassium: 3.3 mmol/L — ABNORMAL LOW (ref 3.5–5.1)
SODIUM: 140 mmol/L (ref 135–145)

## 2018-06-13 LAB — CBC
HCT: 34.3 % — ABNORMAL LOW (ref 36.0–46.0)
HEMOGLOBIN: 10.2 g/dL — AB (ref 12.0–15.0)
MCH: 25.8 pg — ABNORMAL LOW (ref 26.0–34.0)
MCHC: 29.7 g/dL — ABNORMAL LOW (ref 30.0–36.0)
MCV: 86.6 fL (ref 80.0–100.0)
Platelets: 263 10*3/uL (ref 150–400)
RBC: 3.96 MIL/uL (ref 3.87–5.11)
RDW: 14.9 % (ref 11.5–15.5)
WBC: 18.2 10*3/uL — AB (ref 4.0–10.5)
nRBC: 0 % (ref 0.0–0.2)

## 2018-06-13 LAB — COMPREHENSIVE METABOLIC PANEL
ALBUMIN: 3.5 g/dL (ref 3.5–5.0)
ALK PHOS: 62 U/L (ref 38–126)
ALT: 16 U/L (ref 0–44)
AST: 26 U/L (ref 15–41)
Anion gap: 14 (ref 5–15)
BILIRUBIN TOTAL: 1.5 mg/dL — AB (ref 0.3–1.2)
BUN: 12 mg/dL (ref 8–23)
CALCIUM: 9.2 mg/dL (ref 8.9–10.3)
CO2: 21 mmol/L — AB (ref 22–32)
Chloride: 102 mmol/L (ref 98–111)
Creatinine, Ser: 0.83 mg/dL (ref 0.44–1.00)
GFR calc Af Amer: >60 mL/min (ref >60)
GFR calc non Af Amer: >60 mL/min (ref >60)
Glucose, Bld: 165 mg/dL — ABNORMAL HIGH (ref 70–99)
POTASSIUM: 3.5 mmol/L (ref 3.5–5.1)
SODIUM: 137 mmol/L (ref 135–145)
TOTAL PROTEIN: 6.9 g/dL (ref 6.5–8.1)

## 2018-06-13 LAB — TROPONIN I
TROPONIN I: 0.91 ng/mL — AB (ref <0.03)
Troponin I: 1.08 ng/mL (ref <0.03)

## 2018-06-13 LAB — HEPARIN LEVEL (UNFRACTIONATED)
HEPARIN UNFRACTIONATED: 0.26 [IU]/mL — AB (ref 0.30–0.70)
HEPARIN UNFRACTIONATED: 0.55 [IU]/mL (ref 0.30–0.70)

## 2018-06-13 LAB — GLUCOSE, CAPILLARY
GLUCOSE-CAPILLARY: 161 mg/dL — AB (ref 70–99)
GLUCOSE-CAPILLARY: 237 mg/dL — AB (ref 70–99)
Glucose-Capillary: 152 mg/dL — ABNORMAL HIGH (ref 70–99)
Glucose-Capillary: 108 mg/dL — ABNORMAL HIGH (ref 70–99)

## 2018-06-13 LAB — LACTIC ACID, PLASMA
Lactic Acid, Venous: 1.9 mmol/L (ref 0.5–1.9)
Lactic Acid, Venous: 1.6 mmol/L (ref 0.5–1.9)

## 2018-06-13 LAB — BRAIN NATRIURETIC PEPTIDE: B NATRIURETIC PEPTIDE 5: 541.7 pg/mL — AB (ref 0.0–100.0)

## 2018-06-13 SURGERY — LEFT HEART CATH AND CORONARY ANGIOGRAPHY
Anesthesia: LOCAL

## 2018-06-13 MED ORDER — CLOPIDOGREL BISULFATE 75 MG PO TABS
75.0000 mg | ORAL_TABLET | Freq: Every day | ORAL | Status: DC
  Administered 2018-06-14 – 2018-06-15 (×2): 75 mg via ORAL
  Filled 2018-06-13 (×2): qty 1

## 2018-06-13 MED ORDER — POTASSIUM CHLORIDE CRYS ER 20 MEQ PO TBCR
40.0000 meq | EXTENDED_RELEASE_TABLET | Freq: Once | ORAL | Status: AC
  Administered 2018-06-13: 40 meq via ORAL
  Filled 2018-06-13: qty 2

## 2018-06-13 MED ORDER — FUROSEMIDE 10 MG/ML IJ SOLN
40.0000 mg | Freq: Two times a day (BID) | INTRAMUSCULAR | Status: DC
  Administered 2018-06-13: 40 mg via INTRAVENOUS
  Filled 2018-06-13: qty 4

## 2018-06-13 MED ORDER — HEPARIN (PORCINE) IN NACL 1000-0.9 UT/500ML-% IV SOLN
INTRAVENOUS | Status: DC | PRN
  Administered 2018-06-13: 500 mL

## 2018-06-13 MED ORDER — IOHEXOL 350 MG/ML SOLN
INTRAVENOUS | Status: DC | PRN
  Administered 2018-06-13: 95 mL via INTRAVENOUS

## 2018-06-13 MED ORDER — LIDOCAINE HCL (PF) 1 % IJ SOLN
INTRAMUSCULAR | Status: DC | PRN
  Administered 2018-06-13: 2 mL

## 2018-06-13 MED ORDER — SODIUM CHLORIDE 0.9% FLUSH: 3.0000 mL | INTRAVENOUS | Status: DC | PRN

## 2018-06-13 MED ORDER — NITROGLYCERIN 1 MG/10 ML FOR IR/CATH LAB
INTRA_ARTERIAL | Status: AC
  Filled 2018-06-13: qty 10

## 2018-06-13 MED ORDER — SODIUM CHLORIDE 0.9 % WEIGHT BASED INFUSION
3.0000 mL/kg/h | INTRAVENOUS | Status: DC
  Administered 2018-06-13: 0.104 mL/kg/h via INTRAVENOUS

## 2018-06-13 MED ORDER — POTASSIUM CHLORIDE 20 MEQ PO PACK: 40.0000 meq | PACK | Freq: Once | ORAL | Status: DC

## 2018-06-13 MED ORDER — CLOPIDOGREL BISULFATE 300 MG PO TABS
ORAL_TABLET | ORAL | Status: DC | PRN
  Administered 2018-06-13: 600 mg via ORAL

## 2018-06-13 MED ORDER — SODIUM CHLORIDE 0.9 % IV SOLN: 250.0000 mL | INTRAVENOUS | Status: DC | PRN

## 2018-06-13 MED ORDER — CLOPIDOGREL BISULFATE 300 MG PO TABS
ORAL_TABLET | ORAL | Status: AC
  Filled 2018-06-13: qty 2

## 2018-06-13 MED ORDER — HYDRALAZINE HCL 20 MG/ML IJ SOLN: 5.0000 mg | INTRAMUSCULAR | Status: AC | PRN

## 2018-06-13 MED ORDER — HEPARIN SODIUM (PORCINE) 1000 UNIT/ML IJ SOLN
INTRAMUSCULAR | Status: DC | PRN
  Administered 2018-06-13: 2000 [IU] via INTRAVENOUS
  Administered 2018-06-13: 5000 [IU] via INTRAVENOUS
  Administered 2018-06-13: 4500 [IU] via INTRAVENOUS

## 2018-06-13 MED ORDER — ASPIRIN 81 MG PO CHEW: 81.0000 mg | CHEWABLE_TABLET | ORAL | Status: DC

## 2018-06-13 MED ORDER — NITROGLYCERIN 1 MG/10 ML FOR IR/CATH LAB
INTRA_ARTERIAL | Status: DC | PRN
  Administered 2018-06-13: 200 ug

## 2018-06-13 MED ORDER — HEPARIN (PORCINE) IN NACL 1000-0.9 UT/500ML-% IV SOLN
INTRAVENOUS | Status: AC
  Filled 2018-06-13: qty 1000

## 2018-06-13 MED ORDER — POTASSIUM CHLORIDE CRYS ER 20 MEQ PO TBCR
20.0000 meq | EXTENDED_RELEASE_TABLET | Freq: Two times a day (BID) | ORAL | Status: DC
  Administered 2018-06-13 – 2018-06-14 (×2): 20 meq via ORAL
  Filled 2018-06-13 (×2): qty 1

## 2018-06-13 MED ORDER — VERAPAMIL HCL 2.5 MG/ML IV SOLN
INTRAVENOUS | Status: DC | PRN
  Administered 2018-06-13: 18:00:00 via INTRA_ARTERIAL

## 2018-06-13 MED ORDER — VERAPAMIL HCL 2.5 MG/ML IV SOLN
INTRAVENOUS | Status: AC
  Filled 2018-06-13: qty 2

## 2018-06-13 MED ORDER — LABETALOL HCL 5 MG/ML IV SOLN: 10.0000 mg | INTRAVENOUS | Status: AC | PRN

## 2018-06-13 MED ORDER — SODIUM CHLORIDE 0.9 % WEIGHT BASED INFUSION: 3.0000 mL/kg/h | INTRAVENOUS | Status: DC

## 2018-06-13 MED ORDER — SODIUM CHLORIDE 0.9% FLUSH
3.0000 mL | Freq: Two times a day (BID) | INTRAVENOUS | Status: DC
  Administered 2018-06-13: 3 mL via INTRAVENOUS

## 2018-06-13 MED ORDER — SODIUM CHLORIDE 0.9% FLUSH
3.0000 mL | Freq: Two times a day (BID) | INTRAVENOUS | Status: DC
  Administered 2018-06-13 – 2018-06-15 (×3): 3 mL via INTRAVENOUS

## 2018-06-13 MED ORDER — HEPARIN (PORCINE) 25000 UT/250ML-% IV SOLN
1150.0000 [IU]/h | INTRAVENOUS | Status: DC
  Administered 2018-06-14: 1150 [IU]/h via INTRAVENOUS
  Filled 2018-06-13: qty 250

## 2018-06-13 MED ORDER — LIDOCAINE HCL (PF) 1 % IJ SOLN
INTRAMUSCULAR | Status: AC
  Filled 2018-06-13: qty 30

## 2018-06-13 MED ORDER — FENTANYL CITRATE (PF) 100 MCG/2ML IJ SOLN
INTRAMUSCULAR | Status: AC
  Filled 2018-06-13: qty 2

## 2018-06-13 SURGICAL SUPPLY — 18 items
BALLN EMERGE MR 2.5X8 (BALLOONS) ×2
BALLN SAPPHIRE ~~LOC~~ 3.25X8 (BALLOONS) ×2 IMPLANT
BALLOON EMERGE MR 2.5X8 (BALLOONS) ×1 IMPLANT
CATH INFINITI 5FR ANG PIGTAIL (CATHETERS) ×2 IMPLANT
CATH OPTITORQUE TIG 4.0 5F (CATHETERS) ×2 IMPLANT
CATH VISTA GUIDE 6FR XBLAD3.0 (CATHETERS) ×2 IMPLANT
DEVICE RAD COMP TR BAND LRG (VASCULAR PRODUCTS) ×2 IMPLANT
GLIDESHEATH SLEND A-KIT 6F 22G (SHEATH) ×2 IMPLANT
GUIDEWIRE INQWIRE 1.5J.035X260 (WIRE) ×1 IMPLANT
INQWIRE 1.5J .035X260CM (WIRE) ×2
KIT ENCORE 26 ADVANTAGE (KITS) ×2 IMPLANT
KIT HEART LEFT (KITS) ×2 IMPLANT
PACK CARDIAC CATHETERIZATION (CUSTOM PROCEDURE TRAY) ×2 IMPLANT
SHEATH PROBE COVER 6X72 (BAG) ×2 IMPLANT
STENT RESOLUTE ONYX 3.0X12 (Permanent Stent) ×2 IMPLANT
TRANSDUCER W/STOPCOCK (MISCELLANEOUS) ×2 IMPLANT
TUBING CIL FLEX 10 FLL-RA (TUBING) ×2 IMPLANT
WIRE RUNTHROUGH .014X180CM (WIRE) ×2 IMPLANT

## 2018-06-13 NOTE — Plan of Care (Signed)
°  Problem: Education: °Goal: Knowledge of General Education information will improve °Description: Including pain rating scale, medication(s)/side effects and non-pharmacologic comfort measures °Outcome: Progressing °  °Problem: Clinical Measurements: °Goal: Will remain free from infection °Outcome: Progressing °Goal: Respiratory complications will improve °Outcome: Progressing °  °Problem: Coping: °Goal: Level of anxiety will decrease °Outcome: Progressing °  °Problem: Safety: °Goal: Ability to remain free from injury will improve °Outcome: Progressing °  °Problem: Skin Integrity: °Goal: Risk for impaired skin integrity will decrease °Outcome: Progressing °  °

## 2018-06-13 NOTE — Progress Notes (Signed)
[facility]  Date: 06/13/18 Time: 5:26 PM   Patient is scheduled for left heart catheterization due to elevated troponin and fall.  She is currently DNR status.  I have discussed the procedure, including risks and benefits with Donna Ballard and her family.  Donna Ballard has agreed to rescind her DNR/DNI order during the catheterization.  Yvonne Kendallhristopher Nahum Sherrer, MD [facility] Pager: [phone]

## 2018-06-13 NOTE — Progress Notes (Signed)
ANTICOAGULATION CONSULT NOTE  Pharmacy Consult for IV heparin Indication: atrial fibrillation  Allergies  Allergen Reactions  . Amlodipine Shortness Of Breath  . Metformin And Related Shortness Of Breath  . Celecoxib Diarrhea  . Enalapril Maleate Other (See Comments)    Myalgias   . Estrogens Other (See Comments)    Edema  . Furosemide     Myalgia   . Hydrochlorothiazide Other (See Comments)    Hypercalcemia  . Irbesartan Other (See Comments)    Leg cramps   . Lescol [Fluvastatin] Other (See Comments)    myalgia  . Losartan Other (See Comments)    confusion  . Pantoprazole Other (See Comments)    Arthralgia   . Trandolapril Other (See Comments)    Insomnia   . Valsartan Other (See Comments)    Myalgia, rash, insomnia    Patient Measurements: Height: 5\' 1"  (154.9 cm) Weight: 211 lb 6.7 oz (95.9 kg) IBW/kg (Calculated) : 47.8 Heparin Dosing Weight: 70.6 kg  Vital Signs: Temp: 97.7 F (36.5 C) (11/25 1200) Temp Source: Oral (11/25 1200) BP: 120/56 (11/25 1300) Pulse Rate: 84 (11/25 1300)  Labs: Recent Labs    06/12/18 2303 06/13/18 0258 06/13/18 1214  HGB 11.2* 10.2*  --   HCT 36.8 34.3*  --   PLT 292 263  --   APTT 55*  --   --   LABPROT 14.8  --   --   INR 1.17  --   --   HEPARINUNFRC  --  0.26* 0.55  CREATININE 0.83 0.89  --   TROPONINI 0.91* 1.08*  --     Estimated Creatinine Clearance: 48.9 mL/min (by C-G formula based on SCr of 0.89 mg/dL).   Medical History: Past Medical History:  Diagnosis Date  . CHF (congestive heart failure) (HCC)   . Chronic bronchitis (HCC)   . HLD (hyperlipidemia)   . HTN (hypertension)   . Insulin dependent diabetes mellitus (HCC)   . Osteoarthritis     Medications:  Infusions:  . sodium chloride    . [START ON 06/14/2018] sodium chloride    . heparin 1,150 Units/hr (06/13/18 1300)    Assessment: 84 [facility] F [facility] with NSTEMI and new onset atrial fibrillation. Likely cath  11/25. Heparin level at goal currently.  CBC fairly stable, no overt bleeding or complications noted.  Goal of Therapy:  Heparin level 0.3-0.7 units/ml Monitor platelets by anticoagulation protocol: Yes   Plan:  1. Continue IV heparin at current rate. 2. Daily heparin level and CBC. 3. F/u plans for anticoagulation after cath today - could we start Eliquis tonight?  (5 mg BID).  Jenetta DownerJessica Kmari Brian, Pharm D, BCPS, [facility] Clinical Pharmacist Phone [phone]  06/13/2018 1:33 PM

## 2018-06-13 NOTE — Progress Notes (Signed)
Progress Note  Patient Name: Donna Ballard Date of Encounter: 06/13/2018  Primary Cardiologist:   New, Tasha Jindra   Subjective   84 year old female with a history of hypertension, obesity, diabetes mellitus who is admitted with presyncope and a fall. Is in May but has had lots of problems with balance.  She walks with a walker.  Yesterday she was getting up from the toilet and became very dizzy and lightheaded.  EMS was called.  They found her to be hypoxic and she was taken to an outside hospital.  She was placed on BiPAP for hypoxemia.  She was found to have rapid atrial fibrillation which was new. She was given given IV Lasix with a good urine output.  She was transferred to [facility] for heart catheterization. Troponin is 1.08 this morning. Blood cell count is 18.2.  She is afebrile.  Inpatient Medications    Scheduled Meds: . aspirin  81 mg Oral Daily  . atorvastatin  40 mg Oral q1800  . carvedilol  25 mg Oral BID WC  . famotidine  20 mg Oral Daily  . folic acid  500 mcg Oral Daily  . insulin glargine  20 Units Subcutaneous Daily  . mouth rinse  15 mL Mouth Rinse BID  . multivitamin with minerals  1 tablet Oral Daily  . polyethylene glycol  17 g Oral Daily  . polyvinyl alcohol  1 drop Both Eyes BID  . vitamin B-12  500 mcg Oral Daily   Continuous Infusions: . heparin 1,150 Units/hr (06/13/18 0800)   PRN Meds: acetaminophen, albuterol, nitroGLYCERIN, ondansetron (ZOFRAN) IV, traMADol   Vital Signs    Vitals:   06/13/18 0600 06/13/18 0700 06/13/18 0736 06/13/18 0800  BP: (!) 159/71 (!) 157/80  134/61  Pulse: 85 79  72  Resp: (!) 22 19  15   Temp:   97.7 F (36.5 C)   TempSrc:   Oral   SpO2: 97% 97%  97%  Weight:      Height:        Intake/Output Summary (Last 24 hours) at 06/13/2018 0825 Last data filed at 06/13/2018 0800 Gross per 24 hour  Intake 169.71 ml  Output 575 ml  Net -405.29 ml   Filed Weights   06/13/18 0500  Weight: 95.9 kg     Telemetry    Atrial fib with controlled V response  - Personally Reviewed  ECG     atrial fib - Personally Reviewed  Physical Exam   GEN:  Elderly female, no acute distress Neck: No JVD Cardiac:  Irregularly irregular Respiratory: Rales and scant wheezes. GI: Soft, nontender, non-distended  MS: No edema; No deformity. Neuro:  Nonfocal  Psych: Normal affect   Labs    Chemistry Recent Labs  Lab 06/12/18 2303 06/13/18 0258  NA 137 140  K 3.5 3.3*  CL 102 103  CO2 21* 25  GLUCOSE 165* 205*  BUN 12 14  CREATININE 0.83 0.89  CALCIUM 9.2 9.0  PROT 6.9  --   ALBUMIN 3.5  --   AST 26  --   ALT 16  --   ALKPHOS 62  --   BILITOT 1.5*  --   GFRNONAA >60 57*  GFRAA >60 >60  ANIONGAP 14 12     Hematology Recent Labs  Lab 06/12/18 2303 06/13/18 0258  WBC 19.5* 18.2*  RBC 4.23 3.96  HGB 11.2* 10.2*  HCT 36.8 34.3*  MCV 87.0 86.6  MCH 26.5 25.8*  MCHC 30.4 29.7*  RDW 14.6 14.9  PLT 292 263    Cardiac Enzymes Recent Labs  Lab 06/12/18 2303 06/13/18 0258  TROPONINI 0.91* 1.08*   No results for input(s): TROPIPOC in the last 168 hours.   BNP Recent Labs  Lab 06/12/18 2303  BNP 541.7*     DDimer No results for input(s): DDIMER in the last 168 hours.   Radiology    Dg Chest Port 1 View  Result Date: 06/12/2018 CLINICAL DATA:  Shortness of breath.  Fall this morning at home. EXAM: PORTABLE CHEST 1 VIEW COMPARISON:  [facility]adi[facility] and chest CT [facility] FINDINGS: Improving pulmonary edema. Mild cardiomegaly is unchanged. Aortic atherosclerosis. Pleural effusions on CT are not as well seen radiographically. No new focal airspace disease. No pneumothorax. Right shoulder arthroplasty again seen. IMPRESSION: Improving pulmonary edema since radiograph earlier this day. Pleural effusions on chest CT are not well seen radiographically. Aortic Atherosclerosis (ICD10-I70.0). Electronically Signed   By: Narda RutherfordMelanie  Sanford M.D.   On:  06/12/2018 23:37    Cardiac Studies     Patient Profile     [age] y.o. female with new diagnosis of atrial fib,  Admitted with syncopal episode after getting up from the toilet   Assessment & Plan    1.  Atrial fibrillation:  CHADS2VASC is   535  ( female, age [age],  HTN, DM) Her heart rate is better controlled now.  She is not having any chest pain in her breathing is much better.  We will anticipate doing a heart catheterization today.  We will start her on Eliquis today or tomorrow.  2.   Acute CHF:   Likely exacerbated by her rapid atrial fib Echo has been ordered.     Continue lasix for now  3.  NSTEMI:   For cardiac cath today  I've discussed the risks, benefits, options.  He understands and agrees to proceed.    For questions or updates, please contact [facility] Please consult [email or web address] for contact info under     Signed, Kristeen MissPhilip Damarko Stitely, MD  06/13/2018, 8:25 AM

## 2018-06-13 NOTE — Interval H&P Note (Signed)
History and Physical Interval Note:  06/13/2018 5:23 PM  Donna Ballard  has presented today for cardiac catheterization, with the diagnosis of NSTEMI. The various methods of treatment have been discussed with the patient and family. After consideration of risks, benefits and other options for treatment, the patient has consented to  Procedure(s): LEFT HEART CATH AND CORONARY ANGIOGRAPHY (N/A) as a surgical intervention .  The patient's history has been reviewed, patient examined, no change in status, stable for surgery.  I have reviewed the patient's chart and labs.  Questions were answered to the patient's satisfaction.    Cath Lab Visit (complete for each Cath Lab visit)  Clinical Evaluation Leading to the Procedure:   ACS: Yes.    Non-ACS:  N/A  [name]

## 2018-06-13 NOTE — Progress Notes (Signed)
Recent K drawn at 0258 = 3.3  Paged Dr. Liane Comberarnicelli and notified him  Pt is resting, asymptomatic who remains in a-fib and being diuresed w/IV lasix  Occasional PVCs  Pt A&O x4; in no acute distress  New orders: 40 mEq of potassium

## 2018-06-13 NOTE — Progress Notes (Signed)
ANTICOAGULATION CONSULT NOTE   Pharmacy Consult for Heparin  Indication: atrial fibrillation, NSTEMI, s/p cath  Allergies  Allergen Reactions  . Amlodipine Shortness Of Breath  . Metformin And Related Shortness Of Breath  . Celecoxib Diarrhea  . Enalapril Maleate Other (See Comments)    Myalgias   . Estrogens Other (See Comments)    Edema  . Furosemide     Myalgia   . Hydrochlorothiazide Other (See Comments)    Hypercalcemia  . Irbesartan Other (See Comments)    Leg cramps   . Lescol [Fluvastatin] Other (See Comments)    myalgia  . Losartan Other (See Comments)    confusion  . Pantoprazole Other (See Comments)    Arthralgia   . Trandolapril Other (See Comments)    Insomnia   . Valsartan Other (See Comments)    Myalgia, rash, insomnia    Patient Measurements: ~96 kg  Vital Signs: Temp: 98.3 F (36.8 C) (11/25 2000) Temp Source: Oral (11/25 2000) BP: 106/56 (11/25 2230) Pulse Rate: 62 (11/25 2245)   Medical History: Past Medical History:  Diagnosis Date  . CHF (congestive heart failure) (HCC)   . Chronic bronchitis (HCC)   . HLD (hyperlipidemia)   . HTN (hypertension)   . Insulin dependent diabetes mellitus (HCC)   . Osteoarthritis    Assessment: 84 [facility] F [facility] with NSTEM[facility] and new onset atrial fibrillation. Likely cath 11/25. [facility] heparin infusing at 1000 units/hr. Hgb there was 10.6.   11/25 PM update: now s/p cath with plans to resume heparin 2 hours post TR band removal (done at 2330 today).   Goal of Therapy:  Heparin level 0.3-0.7 units/ml Monitor platelets by anticoagulation protocol: Yes   Plan:  -Resume heparin at 1150 units/hr at 0130 on 11/26 -Heparin level on 0930 on 11/26  Abran DukeLedford, Jaila Schellhorn 06/13/2018,11:42 PM

## 2018-06-13 NOTE — Progress Notes (Signed)
Pt's cbg at 06/13/18 at 0000 = 237  Paged Cards fellow (Dr. Liane Comberarnicelli) to see if he wants to put her on a sliding scale and how often he wants to check her blood glucose.   Sts cbg is fine and we do not need to check pt's CBG at the moment. Ok to start pt's Lantus tomorrow eve and will not place her on a sliding scale at the moment.

## 2018-06-13 NOTE — H&P (View-Only) (Signed)
Progress Note  Patient Name: Donna Ballard Date of Encounter: 06/13/2018  Primary Cardiologist:   New, Jvion Turgeon   Subjective   84 year old female with a history of hypertension, obesity, diabetes mellitus who is admitted with presyncope and a fall. Is in May but has had lots of problems with balance.  She walks with a walker.  Yesterday she was getting up from the toilet and became very dizzy and lightheaded.  EMS was called.  They found her to be hypoxic and she was taken to an outside hospital.  She was placed on BiPAP for hypoxemia.  She was found to have rapid atrial fibrillation which was new. She was given given IV Lasix with a good urine output.  She was transferred to [facility] for heart catheterization. Troponin is 1.08 this morning. Blood cell count is 18.2.  She is afebrile.  Inpatient Medications    Scheduled Meds: . aspirin  81 mg Oral Daily  . atorvastatin  40 mg Oral q1800  . carvedilol  25 mg Oral BID WC  . famotidine  20 mg Oral Daily  . folic acid  500 mcg Oral Daily  . insulin glargine  20 Units Subcutaneous Daily  . mouth rinse  15 mL Mouth Rinse BID  . multivitamin with minerals  1 tablet Oral Daily  . polyethylene glycol  17 g Oral Daily  . polyvinyl alcohol  1 drop Both Eyes BID  . vitamin B-12  500 mcg Oral Daily   Continuous Infusions: . heparin 1,150 Units/hr (06/13/18 0800)   PRN Meds: acetaminophen, albuterol, nitroGLYCERIN, ondansetron (ZOFRAN) IV, traMADol   Vital Signs    Vitals:   06/13/18 0600 06/13/18 0700 06/13/18 0736 06/13/18 0800  BP: (!) 159/71 (!) 157/80  134/61  Pulse: 85 79  72  Resp: (!) 22 19  15   Temp:   97.7 F (36.5 C)   TempSrc:   Oral   SpO2: 97% 97%  97%  Weight:      Height:        Intake/Output Summary (Last 24 hours) at 06/13/2018 0825 Last data filed at 06/13/2018 0800 Gross per 24 hour  Intake 169.71 ml  Output 575 ml  Net -405.29 ml   Filed Weights   06/13/18 0500  Weight: 95.9 kg     Telemetry    Atrial fib with controlled V response  - Personally Reviewed  ECG     atrial fib - Personally Reviewed  Physical Exam   GEN:  Elderly female, no acute distress Neck: No JVD Cardiac:  Irregularly irregular Respiratory: Rales and scant wheezes. GI: Soft, nontender, non-distended  MS: No edema; No deformity. Neuro:  Nonfocal  Psych: Normal affect   Labs    Chemistry Recent Labs  Lab 06/12/18 2303 06/13/18 0258  NA 137 140  K 3.5 3.3*  CL 102 103  CO2 21* 25  GLUCOSE 165* 205*  BUN 12 14  CREATININE 0.83 0.89  CALCIUM 9.2 9.0  PROT 6.9  --   ALBUMIN 3.5  --   AST 26  --   ALT 16  --   ALKPHOS 62  --   BILITOT 1.5*  --   GFRNONAA >60 57*  GFRAA >60 >60  ANIONGAP 14 12     Hematology Recent Labs  Lab 06/12/18 2303 06/13/18 0258  WBC 19.5* 18.2*  RBC 4.23 3.96  HGB 11.2* 10.2*  HCT 36.8 34.3*  MCV 87.0 86.6  MCH 26.5 25.8*  MCHC 30.4 29.7*  RDW 14.6 14.9  PLT 292 263    Cardiac Enzymes Recent Labs  Lab 06/12/18 2303 06/13/18 0258  TROPONINI 0.91* 1.08*   No results for input(s): TROPIPOC in the last 168 hours.   BNP Recent Labs  Lab 06/12/18 2303  BNP 541.7*     DDimer No results for input(s): DDIMER in the last 168 hours.   Radiology    Dg Chest Port 1 View  Result Date: 06/12/2018 CLINICAL DATA:  Shortness of breath.  Fall this morning at home. EXAM: PORTABLE CHEST 1 VIEW COMPARISON:  Radiographs and chest CT earlier [facility]. FINDINGS: Improving pulmonary edema. Mild cardiomegaly is unchanged. Aortic atherosclerosis. Pleural effusions on CT are not as well seen radiographically. No new focal airspace disease. No pneumothorax. Right shoulder arthroplasty again seen. IMPRESSION: Improving pulmonary edema since radiograph earlier this day. Pleural effusions on chest CT are not well seen radiographically. Aortic Atherosclerosis (ICD10-I70.0). Electronically Signed   By: Narda RutherfordMelanie  Sanford M.D.   On:  06/12/2018 23:37    Cardiac Studies     Patient Profile     [age] y.o. female with new diagnosis of atrial fib,  Admitted with syncopal episode after getting up from the toilet   Assessment & Plan    1.  Atrial fibrillation:  CHADS2VASC is   535  ( female, age [age],  HTN, DM) Her heart rate is better controlled now.  She is not having any chest pain in her breathing is much better.  We will anticipate doing a heart catheterization today.  We will start her on Eliquis today or tomorrow.  2.   Acute CHF:   Likely exacerbated by her rapid atrial fib Echo has been ordered.     Continue lasix for now  3.  NSTEMI:   For cardiac cath today  I've discussed the risks, benefits, options.  He understands and agrees to proceed.    For questions or updates, please contact [facility] Please consult [email or web address] for contact info under     Signed, Kristeen MissPhilip Teyon Odette, MD  06/13/2018, 8:25 AM

## 2018-06-13 NOTE — Progress Notes (Signed)
ANTICOAGULATION CONSULT NOTE   Pharmacy Consult for Heparin  Indication: atrial fibrillation, NSTEMI  Allergies  Allergen Reactions  . Amlodipine Shortness Of Breath  . Metformin And Related Shortness Of Breath  . Celecoxib Diarrhea  . Enalapril Maleate Other (See Comments)    Myalgias   . Estrogens Other (See Comments)    Edema  . Furosemide     Myalgia   . Hydrochlorothiazide Other (See Comments)    Hypercalcemia  . Irbesartan Other (See Comments)    Leg cramps   . Pantoprazole Other (See Comments)    Arthralgia   . Trandolapril Other (See Comments)    Insomnia   . Valsartan Other (See Comments)    Myalgia, rash, insomnia    Patient Measurements: ~96 kg  Vital Signs: Temp: 98.2 F (36.8 C) (11/25 0000) Temp Source: Oral (11/25 0000) BP: 160/79 (11/25 0300) Pulse Rate: 82 (11/25 0300)   Medical History: Past Medical History:  Diagnosis Date  . CHF (congestive heart failure) (HCC)   . Chronic bronchitis (HCC)   . HLD (hyperlipidemia)   . HTN (hypertension)   . Insulin dependent diabetes mellitus (HCC)   . Osteoarthritis    Assessment: 84 y/o F transfer [facility] with NSTEMI and new onset atrial fibrillation. Likely cath 11/25. Pt arrived from [facility] with heparin infusing at 1000 units/hr. Hgb there was 10.6.   11/25 AM update: heparin level is just below goal, no issues per RN.   Goal of Therapy:  Heparin level 0.3-0.7 units/ml Monitor platelets by anticoagulation protocol: Yes   Plan:  -Inc heparin to 1150 units/hr -Re-check heparin level in 8 hours  Abran DukeLedford, Darrien Laakso 06/13/2018,4:17 AM

## 2018-06-14 ENCOUNTER — Inpatient Hospital Stay (HOSPITAL_COMMUNITY): Payer: Medicare Other

## 2018-06-14 ENCOUNTER — Encounter (HOSPITAL_COMMUNITY): Payer: Self-pay | Admitting: Internal Medicine

## 2018-06-14 DIAGNOSIS — I370 Nonrheumatic pulmonary valve stenosis: Secondary | ICD-10-CM

## 2018-06-14 DIAGNOSIS — I5031 Acute diastolic (congestive) heart failure: Secondary | ICD-10-CM

## 2018-06-14 LAB — CBC
HCT: 32.8 % — ABNORMAL LOW (ref 36.0–46.0)
Hemoglobin: 9.7 g/dL — ABNORMAL LOW (ref 12.0–15.0)
MCH: 26.1 pg (ref 26.0–34.0)
MCHC: 29.6 g/dL — ABNORMAL LOW (ref 30.0–36.0)
MCV: 88.4 fL (ref 80.0–100.0)
Platelets: 286 10*3/uL (ref 150–400)
RBC: 3.71 MIL/uL — ABNORMAL LOW (ref 3.87–5.11)
RDW: 15.2 % (ref 11.5–15.5)
WBC: 17.4 10*3/uL — ABNORMAL HIGH (ref 4.0–10.5)
nRBC: 0 % (ref 0.0–0.2)

## 2018-06-14 LAB — HEPARIN LEVEL (UNFRACTIONATED): Heparin Unfractionated: 0.5 IU/mL (ref 0.30–0.70)

## 2018-06-14 LAB — GLUCOSE, CAPILLARY
GLUCOSE-CAPILLARY: 223 mg/dL — AB (ref 70–99)
Glucose-Capillary: 111 mg/dL — ABNORMAL HIGH (ref 70–99)
Glucose-Capillary: 206 mg/dL — ABNORMAL HIGH (ref 70–99)
Glucose-Capillary: 182 mg/dL — ABNORMAL HIGH (ref 70–99)
Glucose-Capillary: 194 mg/dL — ABNORMAL HIGH (ref 70–99)

## 2018-06-14 LAB — BASIC METABOLIC PANEL
ANION GAP: 8 (ref 5–15)
BUN: 24 mg/dL — ABNORMAL HIGH (ref 8–23)
CHLORIDE: 106 mmol/L (ref 98–111)
CO2: 26 mmol/L (ref 22–32)
Calcium: 8.8 mg/dL — ABNORMAL LOW (ref 8.9–10.3)
Creatinine, Ser: 0.94 mg/dL (ref 0.44–1.00)
GFR calc non Af Amer: 55 mL/min — ABNORMAL LOW (ref >60)
Glucose, Bld: 112 mg/dL — ABNORMAL HIGH (ref 70–99)
POTASSIUM: 4.5 mmol/L (ref 3.5–5.1)
Sodium: 140 mmol/L (ref 135–145)

## 2018-06-14 LAB — ECHOCARDIOGRAM COMPLETE
Height: 61 in
Weight: 3393.32 oz

## 2018-06-14 LAB — POCT ACTIVATED CLOTTING TIME
ACTIVATED CLOTTING TIME: 268 s
ACTIVATED CLOTTING TIME: 268 s

## 2018-06-14 MED ORDER — FUROSEMIDE 40 MG PO TABS
40.0000 mg | ORAL_TABLET | Freq: Every day | ORAL | Status: DC
  Administered 2018-06-14 – 2018-06-15 (×2): 40 mg via ORAL
  Filled 2018-06-14 (×2): qty 1

## 2018-06-14 MED ORDER — POTASSIUM CHLORIDE CRYS ER 20 MEQ PO TBCR
20.0000 meq | EXTENDED_RELEASE_TABLET | Freq: Every day | ORAL | Status: DC
  Administered 2018-06-15: 20 meq via ORAL
  Filled 2018-06-14: qty 1

## 2018-06-14 MED ORDER — APIXABAN 5 MG PO TABS
5.0000 mg | ORAL_TABLET | Freq: Two times a day (BID) | ORAL | Status: DC
  Administered 2018-06-14 – 2018-06-15 (×3): 5 mg via ORAL
  Filled 2018-06-14 (×3): qty 1

## 2018-06-14 MED FILL — Fentanyl Citrate Preservative Free (PF) Inj 100 MCG/2ML: INTRAMUSCULAR | Qty: 2 | Status: AC

## 2018-06-14 NOTE — Progress Notes (Signed)
ANTICOAGULATION CONSULT NOTE  Pharmacy Consult for IV heparin > Eliquis Indication: atrial fibrillation  Allergies  Allergen Reactions  . Amlodipine Shortness Of Breath  . Metformin And Related Shortness Of Breath  . Celecoxib Diarrhea  . Enalapril Maleate Other (See Comments)    Myalgias   . Estrogens Other (See Comments)    Edema  . Furosemide     Myalgia   . Hydrochlorothiazide Other (See Comments)    Hypercalcemia  . Irbesartan Other (See Comments)    Leg cramps   . Lescol [Fluvastatin] Other (See Comments)    myalgia  . Losartan Other (See Comments)    confusion  . Pantoprazole Other (See Comments)    Arthralgia   . Trandolapril Other (See Comments)    Insomnia   . Valsartan Other (See Comments)    Myalgia, rash, insomnia    Patient Measurements: Height: 5\' 1"  (154.9 cm) Weight: 212 lb 1.3 oz (96.2 kg) IBW/kg (Calculated) : 47.8 Heparin Dosing Weight: 70.6 kg  Vital Signs: Temp: 97.6 F (36.4 C) (11/26 0740) Temp Source: Oral (11/26 0740) BP: 107/46 (11/26 1020) Pulse Rate: 80 (11/26 1020)  Labs: Recent Labs    06/12/18 2303 06/13/18 0258 06/13/18 1214 06/14/18 0522 06/14/18 0919  HGB 11.2* 10.2*  --  9.7*  --   HCT 36.8 34.3*  --  32.8*  --   PLT 292 263  --  286  --   APTT 55*  --   --   --   --   LABPROT 14.8  --   --   --   --   INR 1.17  --   --   --   --   HEPARINUNFRC  --  0.26* 0.55  --  0.50  CREATININE 0.83 0.89  --  0.94  --   TROPONINI 0.91* 1.08*  --   --   --     Estimated Creatinine Clearance: 46.4 mL/min (by C-G formula based on SCr of 0.94 mg/dL).   Medical History: Past Medical History:  Diagnosis Date  . CHF (congestive heart failure) (HCC)   . Chronic bronchitis (HCC)   . HLD (hyperlipidemia)   . HTN (hypertension)   . Insulin dependent diabetes mellitus (HCC)   . Osteoarthritis     Medications:  Infusions:  . sodium chloride      Assessment: 84 y/o F transfer from [facility] with NSTEMI and new  onset atrial fibrillation. Likely cath 11/25. Pt arrived from [facility] with heparin infusing at 1000 units/hr. Hgb there was 10.6.   Heparin level at goal this AM.  S/p PCI yesterday  Goal of Therapy:  Heparin level 0.3-0.7 units/ml Monitor platelets by anticoagulation protocol: Yes   Plan:  D/c IV heparin per MD orders Start Eliquis 5 mg BID Will need new Eliquis education prior to discharge.  Jenetta DownerJessica Nyssa Sayegh, Pharm D, BCPS, [facility] Clinical Pharmacist Phone [phone]  06/14/2018 10:49 AM

## 2018-06-14 NOTE — Discharge Instructions (Signed)
Information on my medicine - ELIQUIS (apixaban)  This medication education was reviewed with me or my healthcare representative as part of my discharge preparation.  The pharmacist that spoke with me during my hospital stay was:  Daylene PoseyJonathan  Naydene Kamrowski, [facility]PH  Why was Eliquis prescribed for you? Eliquis was prescribed for you to reduce the risk of forming blood clots that can cause a stroke if you have a medical condition called atrial fibrillation (a type of irregular heartbeat) OR to reduce the risk of a blood clots forming after orthopedic surgery.  What do You need to know about Eliquis ? Take your Eliquis TWICE DAILY - one tablet in the morning and one tablet in the evening with or without food.  It would be best to take the doses about the same time each day.  If you have difficulty swallowing the tablet whole please discuss with your pharmacist how to take the medication safely.  Take Eliquis exactly as prescribed by your doctor and DO NOT stop taking Eliquis without talking to the doctor who prescribed the medication.  Stopping may increase your risk of developing a new clot or stroke.  Refill your prescription before you run out.  After discharge, you should have regular check-up appointments with your healthcare provider that is prescribing your Eliquis.  In the future your dose may need to be changed if your kidney function or weight changes by a significant amount or as you get older.  What do you do if you miss a dose? If you miss a dose, take it as soon as you remember on the same day and resume taking twice daily.  Do not take more than one dose of ELIQUIS at the same time.  Important Safety Information A possible side effect of Eliquis is bleeding. You should call your healthcare provider right away if you experience any of the following: ? Bleeding from an injury or your nose that does not stop. ? Unusual colored urine (red or dark brown) or unusual colored stools (red or  black). ? Unusual bruising for unknown reasons. ? A serious fall or if you hit your head (even if there is no bleeding).  Some medicines may interact with Eliquis and might increase your risk of bleeding or clotting while on Eliquis. To help avoid this, consult your healthcare provider or pharmacist prior to using any new prescription or non-prescription medications, including herbals, vitamins, non-steroidal anti-inflammatory drugs (NSAIDs) and supplements.  This website has more information on Eliquis (apixaban): [email or web address].

## 2018-06-14 NOTE — Progress Notes (Signed)
Progress Note  Patient Name: Donna Ballard Date of Encounter: 06/14/2018  Primary Cardiologist:   New, Nahser   Subjective   84 year old Ballard with a history of hypertension, obesity, diabetes mellitus who is admitted with presyncope and a fall. Donna Ballard has had lots of problems with balance.  She walks with a walker.  Yesterday she was getting up from the toilet and became very dizzy and lightheaded.  EMS was called.  They found her to be hypoxic and she was taken to an outside hospital.  She was placed on BiPAP for hypoxemia.  She was found to have rapid atrial fibrillation which was new. She was given given IV Lasix with a good urine output.  She was transferred to [facility] for heart catheterization. Troponin is 1.08 this morning. Blood cell count is 18.2.  She is afebrile.  She had PCI / stenting of her mid LAD yesterday Seems to be feeling better [ she is breathing mnuch better     Inpatient Medications    Scheduled Meds: . aspirin  81 mg Oral Daily  . atorvastatin  40 mg Oral q1800  . carvedilol  25 mg Oral BID WC  . clopidogrel  75 mg Oral Q breakfast  . famotidine  20 mg Oral Daily  . folic acid  500 mcg Oral Daily  . insulin glargine  20 Units Subcutaneous Daily  . mouth rinse  15 mL Mouth Rinse BID  . multivitamin with minerals  1 tablet Oral Daily  . polyethylene glycol  17 g Oral Daily  . polyvinyl alcohol  1 drop Both Eyes BID  . potassium chloride  20 mEq Oral BID  . sodium chloride flush  3 mL Intravenous Q12H  . vitamin B-12  500 mcg Oral Daily   Continuous Infusions: . sodium chloride    . heparin 1,150 Units/hr (06/14/18 0600)   PRN Meds: sodium chloride, acetaminophen, albuterol, nitroGLYCERIN, ondansetron (ZOFRAN) IV, sodium chloride flush, traMADol   Vital Signs    Vitals:   06/14/18 0300 06/14/18 0400 06/14/18 0500 06/14/18 0600  BP: (!) 122/56 (!) 152/77 (!) 142/65 (!) 161/81  Pulse: 83 87 88 83  Resp: (!) 36 18 18 14     Temp:  (!) 97.5 F (36.4 C)    TempSrc:  Oral    SpO2: 92% 92% 92% 96%  Weight:      Height:        Intake/Output Summary (Last 24 hours) at 06/14/2018 0606 Last data filed at 06/14/2018 0600 Gross per 24 hour  Intake 739.94 ml  Output 800 ml  Net -60.06 ml   Filed Weights   06/13/18 0500  Weight: 95.9 kg    Telemetry    Afib with controlled V response   - Personally Reviewed  ECG     atrial fib - Personally Reviewed  Physical Exam   Physical Exam: Blood pressure (!) 161/81, pulse 83, temperature (!) 97.5 F (36.4 C), temperature source Oral, resp. rate 14, height 5\' 1"  (1.549 m), weight 95.9 kg, SpO2 96 %.  GEN:   Donna Ballard, NAD  HEENT: Normal NECK: No JVD; No carotid bruits LYMPHATICS: No lymphadenopathy CARDIAC:   Irreg. Irreg. , no murmurs, rubs, gallops RESPIRATORY:  Clear to auscultation without rales, wheezing or rhonchi  ABDOMEN: Soft, non-tender, non-distended MUSCULOSKELETAL:  Right radial cath site looks good  SKIN: Warm and dry NEUROLOGIC:  Alert and oriented x 3   Labs    Chemistry Recent Labs  Lab  06/12/18 2303 06/13/18 0258  NA 137 140  K 3.5 3.3*  CL 102 103  CO2 21* 25  GLUCOSE 165* 205*  BUN 12 14  CREATININE 0.83 0.89  CALCIUM 9.2 9.0  PROT 6.9  --   ALBUMIN 3.5  --   AST 26  --   ALT 16  --   ALKPHOS 62  --   BILITOT 1.5*  --   GFRNONAA >60 57*  GFRAA >60 >60  ANIONGAP 14 12     Hematology Recent Labs  Lab 06/12/18 2303 06/13/18 0258  WBC 19.5* 18.2*  RBC 4.23 3.96  HGB 11.2* 10.2*  HCT 36.8 34.3*  MCV 87.0 86.6  MCH 26.5 25.8*  MCHC 30.4 29.7*  RDW 14.6 14.9  PLT 292 263    Cardiac Enzymes Recent Labs  Lab 06/12/18 2303 06/13/18 0258  TROPONINI 0.91* 1.08*   No results for input(s): TROPIPOC in the last 168 hours.   BNP Recent Labs  Lab 06/12/18 2303  BNP 541.7*     DDimer No results for input(s): DDIMER in the last 168 hours.   Radiology    Dg Chest Port 1 View  Result Date:  06/12/2018 CLINICAL DATA:  Shortness of breath.  Fall this morning at home. EXAM: PORTABLE CHEST 1 VIEW COMPARISON:  Radiographs and chest CT [facility]. FINDINGS: Improving pulmonary edema. Mild cardiomegaly is unchanged. Aortic atherosclerosis. Pleural effusions on CT are not as well seen radiographically. No new focal airspace disease. No pneumothorax. Right shoulder arthroplasty again seen. IMPRESSION: Improving pulmonary edema since radiograph earlier this day. Pleural effusions on chest CT are not well seen radiographically. Aortic Atherosclerosis (ICD10-I70.0). Electronically Signed   By: Narda RutherfordMelanie  Sanford M.D.   On: 06/12/2018 23:37    Cardiac Studies     Patient Profile     84 y.o. 84 y.o. Ballard with new diagnosis of atrial fib,  Admitted with syncopal episode after getting up from the toilet   Assessment & Plan    1.  Atrial fibrillation:  CHADS2VASC is   555  ( Ballard, age 84,  HTN, DM) Her heart rate is better controlled now Will add Eliquis  5 mg BID DC ASA after Eliquis is started.  DC heparin  2.   Acute CHF:   Likely exacerbated by her rapid atrial fib and CAD Her breathing is much better now that her mid LAD has been stented.  Add lasix 40 mg PO a day  kdur 20 daily      3.  NSTEMI:   Cath yesterday revealed a tight mid LAD. She had stent placement to the mid LAD by Dr. Okey DupreEnd and is feeling better      For questions or updates, please contact [facility] Please consult [email or web address] for contact info under   Transfer to tele Ambulate Anticipate DC tomorrow    Signed, Kristeen MissPhilip Nahser, MD  06/14/2018, 6:06 AM

## 2018-06-14 NOTE — Plan of Care (Signed)
  Problem: Education: Goal: Knowledge of General Education information will improve Description Including pain rating scale, medication(s)/side effects and non-pharmacologic comfort measures Outcome: Progressing   Problem: Health Behavior/Discharge Planning: Goal: Ability to manage health-related needs will improve Outcome: Progressing   Problem: Clinical Measurements: Goal: Will remain free from infection Outcome: Progressing Goal: Respiratory complications will improve Outcome: Progressing   Problem: Activity: Goal: Risk for activity intolerance will decrease Outcome: Progressing   Problem: Nutrition: Goal: Adequate nutrition will be maintained Outcome: Progressing   Problem: Coping: Goal: Level of anxiety will decrease Outcome: Progressing   Problem: Elimination: Goal: Will not experience complications related to bowel motility Outcome: Progressing Goal: Will not experience complications related to urinary retention Outcome: Progressing   Problem: Pain Managment: Goal: General experience of comfort will improve Outcome: Progressing   Problem: Safety: Goal: Ability to remain free from injury will improve Outcome: Progressing   Problem: Skin Integrity: Goal: Risk for impaired skin integrity will decrease Outcome: Progressing   Problem: Education: Goal: Knowledge of disease or condition will improve Outcome: Progressing Goal: Understanding of medication regimen will improve Outcome: Progressing

## 2018-06-14 NOTE — Progress Notes (Addendum)
Pt's TR band came off around 2315, level 0  Order received from pharmacy to hang Heparin... Went to hang around 0130 and noticed small amount of blood on pillow.   Held pressure x20 min... Bleeding subsided... Placed 4x4 gauze and secured w/transparent dressing... Has small hematoma around site.   Paged cards fellow (Dr. Allena Katzhakravartti) for further orders if needed.   Pt is asymptomatic... Most recent VS: BP = 160/71 (98), HR = 85, RR = 21, O2 = 95 room air  Pt is A&O x4... No acute distress.    Dr. Allena Katzhakravartti called back... Has been updated... sts if it starts to bleed again, we might just have to place TR band again at 10-12 cc of air... To call back if it starts to bleed again.   Pt has Hep gtt running at 11.5  Will continue to monitor pt closely.

## 2018-06-14 NOTE — Progress Notes (Signed)
  Echocardiogram 2D Echocardiogram has been performed.  Donna Ballard 06/14/2018, 2:23 PM

## 2018-06-14 NOTE — Progress Notes (Signed)
Report given to Robin, RN on 6E  

## 2018-06-14 NOTE — Plan of Care (Signed)

## 2018-06-14 NOTE — Progress Notes (Addendum)
CARDIAC REHAB PHASE I   PRE:  Rate/Rhythm: 72 afib  BP:  Supine: 152/56  Sitting:   Standing:    SaO2: 96%RA  MODE:  Ambulation: 64 ft   POST:  Rate/Rhythm: 94 afib  BP:  Supine:   Sitting: 154/60  Standing:    SaO2: 95%RA 1305-1345 Pt assisted to bathroom and then walked 64 ft with rolling walker, gait belt use, asst x 2. We followed with rollator but pt did not need to sit. Pt stopped several times due to pain in right hip and leg from fall prior to admission. Pt lives alone but has son that lives next door. All of her children live within 7 miles. Would recommend PT to see and evaluate for discharge. Pt may need home safety eval since she fell prior to admission. No CP but did c/o a little SOB after walk. Back to bed for ECHO. Discussed with pt and son the importance of plavix with stent and gave MI booklet. Pt has CRP 2 referral. Will notify [facility].   Luetta Nuttingharlene Atlas Kuc, RN BSN  06/14/2018 1:47 PM

## 2018-06-14 NOTE — Progress Notes (Signed)
Paged cars fellow (Dr. Allena Katzhakravartti)  Notified pt's cbg = 223 around 2200. Pt ate around 2030  Order sts to page if >140  Dr. Allena Katzhakravartti sts no further action is needed at the moment. Will wait for the morning cbg and if it's still elevated then, we can re-address and possibly place on a sliding scale  Pt is asymptomatic and in no acute distress... Will continue to monitor pt closely.

## 2018-06-15 ENCOUNTER — Encounter (HOSPITAL_COMMUNITY): Payer: Self-pay | Admitting: Physician Assistant

## 2018-06-15 DIAGNOSIS — J9601 Acute respiratory failure with hypoxia: Secondary | ICD-10-CM

## 2018-06-15 DIAGNOSIS — I4819 Other persistent atrial fibrillation: Secondary | ICD-10-CM

## 2018-06-15 DIAGNOSIS — E876 Hypokalemia: Secondary | ICD-10-CM

## 2018-06-15 DIAGNOSIS — I25118 Atherosclerotic heart disease of native coronary artery with other forms of angina pectoris: Secondary | ICD-10-CM

## 2018-06-15 DIAGNOSIS — D72829 Elevated white blood cell count, unspecified: Secondary | ICD-10-CM

## 2018-06-15 DIAGNOSIS — I25110 Atherosclerotic heart disease of native coronary artery with unstable angina pectoris: Secondary | ICD-10-CM

## 2018-06-15 DIAGNOSIS — I119 Hypertensive heart disease without heart failure: Secondary | ICD-10-CM

## 2018-06-15 DIAGNOSIS — I4891 Unspecified atrial fibrillation: Principal | ICD-10-CM

## 2018-06-15 DIAGNOSIS — I5032 Chronic diastolic (congestive) heart failure: Secondary | ICD-10-CM

## 2018-06-15 DIAGNOSIS — E785 Hyperlipidemia, unspecified: Secondary | ICD-10-CM

## 2018-06-15 DIAGNOSIS — D649 Anemia, unspecified: Secondary | ICD-10-CM

## 2018-06-15 DIAGNOSIS — R2681 Unsteadiness on feet: Secondary | ICD-10-CM

## 2018-06-15 HISTORY — DX: Atherosclerotic heart disease of native coronary artery with unstable angina pectoris: I25.110

## 2018-06-15 HISTORY — DX: Hypertensive heart disease without heart failure: I11.9

## 2018-06-15 HISTORY — DX: Morbid (severe) obesity due to excess calories: E66.01

## 2018-06-15 HISTORY — DX: Acute respiratory failure with hypoxia: J96.01

## 2018-06-15 HISTORY — DX: Other persistent atrial fibrillation: I48.19

## 2018-06-15 HISTORY — DX: Hyperlipidemia, unspecified: E78.5

## 2018-06-15 HISTORY — DX: Atherosclerotic heart disease of native coronary artery with other forms of angina pectoris: I25.118

## 2018-06-15 HISTORY — DX: Chronic diastolic (congestive) heart failure: I50.32

## 2018-06-15 HISTORY — DX: Elevated white blood cell count, unspecified: D72.829

## 2018-06-15 LAB — CBC
HEMATOCRIT: 32 % — AB (ref 36.0–46.0)
HEMOGLOBIN: 9.6 g/dL — AB (ref 12.0–15.0)
MCH: 26.5 pg (ref 26.0–34.0)
MCHC: 30 g/dL (ref 30.0–36.0)
MCV: 88.4 fL (ref 80.0–100.0)
NRBC: 0 % (ref 0.0–0.2)
PLATELETS: 249 10*3/uL (ref 150–400)
RBC: 3.62 MIL/uL — AB (ref 3.87–5.11)
RDW: 15.3 % (ref 11.5–15.5)
WBC: 14.5 10*3/uL — ABNORMAL HIGH (ref 4.0–10.5)

## 2018-06-15 LAB — GLUCOSE, CAPILLARY
GLUCOSE-CAPILLARY: 176 mg/dL — AB (ref 70–99)
Glucose-Capillary: 132 mg/dL — ABNORMAL HIGH (ref 70–99)
Glucose-Capillary: 227 mg/dL — ABNORMAL HIGH (ref 70–99)

## 2018-06-15 MED ORDER — FUROSEMIDE 40 MG PO TABS: 40.0000 mg | ORAL_TABLET | Freq: Every day | ORAL | 6 refills | Status: DC

## 2018-06-15 MED ORDER — POTASSIUM CHLORIDE CRYS ER 20 MEQ PO TBCR: 20.0000 meq | EXTENDED_RELEASE_TABLET | Freq: Every day | ORAL | 6 refills | Status: DC

## 2018-06-15 MED ORDER — ATORVASTATIN CALCIUM 40 MG PO TABS: 40.0000 mg | ORAL_TABLET | Freq: Every evening | ORAL | 6 refills | Status: DC

## 2018-06-15 MED ORDER — CLOPIDOGREL BISULFATE 75 MG PO TABS: 75.0000 mg | ORAL_TABLET | Freq: Every day | ORAL | 6 refills | Status: DC

## 2018-06-15 MED ORDER — APIXABAN 5 MG PO TABS: 5.0000 mg | ORAL_TABLET | Freq: Two times a day (BID) | ORAL | 6 refills | Status: DC

## 2018-06-15 NOTE — Care Management Note (Addendum)
Case Management Note  Patient Details  Name: Camillo FlamingVergie Fahmida Coscia MRN: [id number] Date of Birth: 01/02/1931  Subjective/Objective: Pt presented for presyncope-fall. PTA from home alone- has medical alert and DME RW in the home. Patients son lives 30 ft from her home and daughter lives about a half a mile away. PT/OT to consult. Pt uses [facility]. Patient has Part D Rx Drug Coverage. Benefits check in process for Eliquis. CM did provide patient with 30 day free card.                   Action/Plan: CM will continue to monitor for disposition needs.   Expected Discharge Date:                  Expected Discharge Plan:  Home w Home Health Services  In-House Referral:   Clinical Social Work  Discharge planning Services  CM Consult, Medication Assistance  Post Acute Care Choice:   N/A Choice offered to:   N/A  DME Arranged:   N/A DME Agency:   N/A  HH Arranged:   N/A HH Agency:   N/A  Status of Service:  Completed If discussed at Long Length of Stay Meetings, dates discussed:    Additional Comments: 1220 06-15-18 Tomi BambergerBrenda Graves-Bigelow, RN,BSN [phone] PT/OT consult- recommendations for SNF- Family at bedside- wants [facility]. CM did speak with CSW to make her aware of SNF choice. No further needs from CM at this time.  Gala LewandowskyGraves-Bigelow, Tyee Vandevoorde Kaye, RN 06/15/2018, 11:21 AM

## 2018-06-15 NOTE — Clinical Social Work Placement (Signed)
   CLINICAL SOCIAL WORK PLACEMENT  NOTE  Date:  06/15/2018  Patient Details  Name: Donna Ballard MRN: [id number] Date of Birth: 11/17/1930  Clinical Social Work is seeking post-discharge placement for this patient at the Skilled  Nursing Facility level of care (*CSW will initial, date and re-position this form in  chart as items are completed):  Yes   Patient/family provided with [place] Clinical Social Work Department's list of facilities offering this level of care within the geographic area requested by the patient (or if unable, by the patient's family).  Yes   Patient/family informed of their freedom to choose among providers that offer the needed level of care, that participate in Medicare, Medicaid or managed care program needed by the patient, have an available bed and are willing to accept the patient.  Yes   Patient/family informed of 's ownership interest in [facility] and [facility], as well as of the fact that they are under no obligation to receive care at these facilities.  PASRR submitted to EDS on       PASRR number received on       Existing PASRR number confirmed on 06/15/18     FL2 transmitted to all facilities in geographic area requested by pt/family on 06/15/18     FL2 transmitted to all facilities within larger geographic area on       Patient informed that his/her managed care company has contracts with or will negotiate with certain facilities, including the following:  [facility] [place]     Yes   Patient/family informed of bed offers received.  [facility]     Physician recommends and patient chooses bed at      Patient to be transferred to [facility] [place] on 06/15/18.  Patient to be transferred to facility by daughter will drive     Patient family notified on 06/15/18 of transfer.  Name of family member notified:  Donna Blonderenise Ballard,daughter     PHYSICIAN Please prepare priority discharge  summary, including medications, Please prepare prescriptions, Please sign FL2     Additional Comment:    _______________________________________________ Abigail ButtsSusan Andersyn Fragoso, LCSW 06/15/2018, 2:09 PM

## 2018-06-15 NOTE — Clinical Social Work Note (Signed)
Clinical Social Work Assessment  Patient Details  Name: Donna Ballard MRN: [id number] Date of Birth: Mar 21, 1931  Date of referral:  06/15/18               Reason for consult:  Facility Placement, Discharge Planning                Permission sought to share information with:  Facility Sport and exercise psychologist, Family Supports Permission granted to share information::  Yes, Verbal Permission Granted  Name::     [place]  Agency::  SNFs  Relationship::  daughter  Contact Information:  [phone]  Housing/Transportation Living arrangements for the past 2 months:  [place] of Information:  Patient, Adult Children Patient Interpreter Needed:  None Criminal Activity/Legal Involvement Pertinent to Current Situation/Hospitalization:  No - Comment as needed Significant Relationships:  Adult Children Lives with:  Self Do you feel safe going back to the place where you live?  Yes Need for family participation in patient care:  Yes (Comment)  Care giving concerns: Patient from home independently, admitted after a fall. PT recommending SNF.   Social Worker assessment / plan: CSW met with patient and family at bedside. Patient alert and oriented. CSW introduced self and role and discussed disposition planning - PT recommendation for SNF.   Patient and family agreeable to SNF and prefer [facility]. CSW sent referral to [facility], awaiting bed offer.  Patient's social security number not on file and patient does not know her SSN by heart. Will need SSN to screen for PASRR. PASRR required before patient can admit to SNF. Awaiting call back from daughter for assistance with SSN.  CSW to follow and support.  Employment status:  Retired Forensic scientist:  Medicare PT Recommendations:  [place] / Referral to community resources:  [place]  Patient/Family's Response to care: Patient and family appreciative of  care.  Patient/Family's Understanding of and Emotional Response to Diagnosis, Current Treatment, and Prognosis: Patient and family with understanding of patient's conditions and care needs. Agreeable to SNF.  Emotional Assessment Appearance:  Appears stated age Attitude/Demeanor/Rapport:  Engaged Affect (typically observed):  Accepting, Calm, Pleasant, Appropriate Orientation:  Oriented to Self, Oriented to Place, Oriented to  Time, Oriented to Situation Alcohol / Substance use:  Not Applicable Psych involvement (Current and /or in the community):  No (Comment)  Discharge Needs  Concerns to be addressed:  Discharge Planning Concerns, Care Coordination Readmission within the last 30 days:  No Current discharge risk:  Physical Impairment, Lives alone Barriers to Discharge:  Continued Medical Work up   Estanislado Emms, LCSW 06/15/2018, 12:53 PM

## 2018-06-15 NOTE — Progress Notes (Signed)
Progress Note  Patient Name: Donna GroveVergie Ahana Ballard Date of Encounter: 06/15/2018  Primary Cardiologist:   New,    Subjective   84 year old female with a history of hypertension, obesity, diabetes mellitus who is admitted with presyncope and a fall. Donna Ballard has had lots of problems with balance.  She walks with a walker.  Yesterday she was getting up from the toilet and became very dizzy and lightheaded.  EMS was called.  They found her to be hypoxic and she was taken to an outside hospital.  She was placed on BiPAP for hypoxemia.  She was found to have rapid atrial fibrillation which was new. She was given given IV Lasix with a good urine output.  [facility] was [facility] for heart catheter[facility]. Troponin is 1.08 this morning. Blood cell count is 18.2.  She is afebrile.  She had PCI / stenting of her mid LAD yesterday Seems to be feeling better  she is breathing mnuch better   Was able to walk only 64 feet with cardiac rehab this am They have recommended a PT and OT conuslt     Inpatient Medications    Scheduled Meds: . apixaban  5 mg Oral BID  . atorvastatin  40 mg Oral q1800  . carvedilol  25 mg Oral BID WC  . clopidogrel  75 mg Oral Q breakfast  . famotidine  20 mg Oral Daily  . folic acid  500 mcg Oral Daily  . furosemide  40 mg Oral Daily  . insulin glargine  20 Units Subcutaneous Daily  . mouth rinse  15 mL Mouth Rinse BID  . multivitamin with minerals  1 tablet Oral Daily  . polyethylene glycol  17 g Oral Daily  . polyvinyl alcohol  1 drop Both Eyes BID  . potassium chloride  20 mEq Oral Daily  . sodium chloride flush  3 mL Intravenous Q12H  . vitamin B-12  500 mcg Oral Daily   Continuous Infusions: . sodium chloride     PRN Meds: sodium chloride, acetaminophen, albuterol, nitroGLYCERIN, ondansetron (ZOFRAN) IV, sodium chloride flush, traMADol   Vital Signs    Vitals:   06/14/18 1116 06/14/18 1441 06/14/18 2202 06/15/18 0456    BP: (!) 170/62 (!) 135/48 (!) 162/72 (!) 172/78  Pulse: 69 76 73 69  Resp:  16    Temp: 98.3 F (36.8 C) 98.8 F (37.1 C) 98.8 F (37.1 C) 98.2 F (36.8 C)  TempSrc: Oral Oral Oral Oral  SpO2: 96% 95% 96% 97%  Weight:    97.1 kg  Height:        Intake/Output Summary (Last 24 hours) at 06/15/2018 0942 Last data filed at 06/14/2018 1859 Gross per 24 hour  Intake 491.36 ml  Output -  Net 491.36 ml   Filed Weights   06/13/18 0500 06/14/18 0600 06/15/18 0456  Weight: 95.9 kg 96.2 kg 97.1 kg    Telemetry    Afib with controlled V response    - Personally Reviewed  ECG       Physical Exam   Physical Exam: Blood pressure (!) 172/78, pulse 69, temperature 98.2 F (36.8 C), temperature source Oral, resp. rate 16, height 5\' 1"  (1.549 m), weight 97.1 kg, SpO2 97 %.  GEN:  Well nourished, well developed in no acute distress HEENT: Normal NECK: No JVD; No carotid bruits LYMPHATICS: No lymphadenopathy CARDIAC: Irreg. Irreg.  RESPIRATORY:  Clear to auscultation without rales, wheezing or rhonchi  ABDOMEN: Soft, non-tender, non-distended  MUSCULOSKELETAL:  No edema; No deformity  SKIN: Warm and dry NEUROLOGIC:  Alert and oriented x 3   Labs    Chemistry Recent Labs  Lab 06/12/18 2303 06/13/18 0258 06/14/18 0522  NA 137 140 140  K 3.5 3.3* 4.5  CL 102 103 106  CO2 21* 25 26  GLUCOSE 165* 205* 112*  BUN 12 14 24*  CREATININE 0.83 0.89 0.94  CALCIUM 9.2 9.0 8.8*  PROT 6.9  --   --   ALBUMIN 3.5  --   --   AST 26  --   --   ALT 16  --   --   ALKPHOS 62  --   --   BILITOT 1.5*  --   --   GFRNONAA >60 57* 55*  GFRAA >60 >60 >60  ANIONGAP 14 12 8      Hematology Recent Labs  Lab 06/13/18 0258 06/14/18 0522 06/15/18 0423  WBC 18.2* 17.4* 14.5*  RBC 3.96 3.71* 3.62*  HGB 10.2* 9.7* 9.6*  HCT 34.3* 32.8* 32.0*  MCV 86.6 88.4 88.4  MCH 25.8* 26.1 26.5  MCHC 29.7* 29.6* 30.0  RDW 14.9 15.2 15.3  PLT 263 286 249    Cardiac Enzymes Recent Labs  Lab  06/12/18 2303 06/13/18 0258  TROPONINI 0.91* 1.08*   No results for input(s): TROPIPOC in the last 168 hours.   BNP Recent Labs  Lab 06/12/18 2303  BNP 541.7*     DDimer No results for input(s): DDIMER in the last 168 hours.   Radiology    No results found.  Cardiac Studies     Patient Profile     84 y.o. female with new diagnosis of atrial fib,  Admitted with syncopal episode after getting up from the toilet   Assessment & Plan    1.  Atrial fibrillation:  CHADS2VASC is   64  ( female, age 6,  HTN, DM) Her heart rate is better controlled now  continue rate control.  She is asymptomatic.    Has normal LV function.   I think we will continue with a rate control / anticoagulaton strategy  2.   Acute CHF:      Much better after PCI of her mid LAD   3.  NSTEMI:    S/pp PCI of LAD  4.  Disposition:   Getting PT /OT eval      Kristeen Miss, MD  06/15/2018 9:54 AM    [facility] [address] [place],  [address] [place], [place]  [address] Pager [phone] Phone: [phone]; Fax: [phone]

## 2018-06-15 NOTE — Care Management (Signed)
#    8. S/W  ROBERT  @ [facility] [employer] # [phone]  OPT- 2   1. ELIQUIS  5 MG BID COVER- YES CO-PAY- $ 8.50 TIER- 3 DRUG PRIOR APPROVAL- NO  2. ELIQUIS   2.5 MG BID COVER-YES CO-PAY- $ 8.50 TIER- 3 DRUG PRIOR APPROVAL- NO  PATIENT HAS : LOW INCOME SUBSIDY  PREFERRED PHARMACY : YES [facility], [place]  AND [facility] M/O 90 DAY SUPPLY FOR M/O $ 6.00 90 DAY SUPPLY FOR RETAIL $ 8.50

## 2018-06-15 NOTE — Evaluation (Signed)
Occupational Therapy Evaluation and Discharge Patient Details Name: Donna Ballard MRN: [id number] DOB: [birth year] Today's Date: 06/15/2018    History of Present Illness Pt is an 84 y.o. female admitted 06/12/18  with presyncope and fall; found to be hypoxic and in rapid a-fib. Chest CT suggestive of pulmonary edema with small bilateral pleural effusions. Head CT without acute abnormality; small forehead hematoma. R hip xray without fx. Worked up for NSTEMI; s/p cardiac cath and PCI of LAD 11/25. PMH includes HTN, DM, obesity.    Clinical Impression   This 84 yo female admitted with above presents to acute OT at a Mod A -setup/S level for basic ADLs and pt is normally Mod I for basic ADLs and does most of her IADLs. She will benefit from continued OT at [facility] remainder of OT to that facility.    Follow Up Recommendations  SNF;Supervision/Assistance - 24 hour    Equipment Recommendations  Other (comment)(TBD at next venue)       Precautions / Restrictions Precautions Precautions: Fall Restrictions Weight Bearing Restrictions: No      Mobility Bed Mobility Overal bed mobility: Modified Independent             General bed mobility comments: HOB elevated, increased time and effort--for OOB  Transfers Overall transfer level: Needs assistance Equipment used: Rolling walker (2 wheeled) Transfers: Sit to/from Stand Sit to Stand: Min guard         General transfer comment: Reliant on momentum and BUE support to power into standing    Balance Overall balance assessment: Needs assistance Sitting-balance support: No upper extremity supported;Feet supported Sitting balance-Leahy Scale: Fair Sitting balance - Comments: Difficulty reaching feet sitting EOB   Standing balance support: No upper extremity supported;During functional activity Standing balance-Leahy Scale: Fair Standing balance comment: standing to wash hands at sink                            ADL either performed or assessed with clinical judgement   ADL Overall ADL's : Needs assistance/impaired Eating/Feeding: Independent;Sitting   Grooming: Min guard;Standing;Wash/dry hands   Upper Body Bathing: Set up;Sitting   Lower Body Bathing: Moderate assistance Lower Body Bathing Details (indicate cue type and reason): min guard A sit<>stand Upper Body Dressing : Set up;Sitting   Lower Body Dressing: Moderate assistance Lower Body Dressing Details (indicate cue type and reason): min guard A sit<>stand Toilet Transfer: Min guard;Ambulation;RW;BSC Toilet Transfer Details (indicate cue type and reason): over toilet Toileting- Clothing Manipulation and Hygiene: Total assistance Toileting - Clothing Manipulation Details (indicate cue type and reason): min guard A sit<>stand             Vision Patient Visual Report: No change from baseline              Pertinent Vitals/Pain Pain Assessment: Faces Faces Pain Scale: Hurts a little bit Pain Location: right hip Pain Descriptors / Indicators: Sore Pain Intervention(s): Limited activity within patient's tolerance;Monitored during session;Repositioned     Hand Dominance Right   Extremity/Trunk Assessment Upper Extremity Assessment Upper Extremity Assessment: Overall WFL for tasks assessed       Communication Communication Communication: No difficulties   Cognition Arousal/Alertness: Awake/alert Behavior During Therapy: WFL for tasks assessed/performed Overall Cognitive Status: Within Functional Limits for tasks assessed Area of Impairment: Awareness;Safety/judgement                         Safety/Judgement:;Decreased awareness  of safety Awareness: Emergent                  Home Living Family/patient expects to be discharged to:: Skilled nursing facility Living Arrangements: Alone Available Help at Discharge: Family;Available PRN/intermittently Type of Home: House Home Access: Stairs to  enter Enter[employer] CorporationEntrance Stairs-Number of Steps: 3 Entrance Stairs-Rails: Right;Left;Can reach both Home Layout: One level     Bathroom Shower/Tub: Chief Strategy OfficerTub/shower unit   Bathroom Toilet: Handicapped height     Home Equipment: Environmental consultantWalker - 2 wheels;Grab bars - tub/shower;Shower seat;Toilet riser;Other (comment)(3 wheeled walker)   Additional Comments: Son lives next door. Multiple children live nearby - all work during day      Prior Functioning/Environment Level of Independence: Independent with assistive device(s)        Comments: Mod indep with 3-wheeled walker. Children drive        OT Problem List: Decreased range of motion;Decreased strength;Impaired balance (sitting and/or standing);Pain;Obesity;Decreased knowledge of use of DME or AE         OT Goals(Current goals can be found in the care plan section) Acute Rehab OT Goals Patient Stated Goal: to be able to move around by myself and get back home  OT Frequency:                AM-PAC OT "6 Clicks" Daily Activity     Outcome Measure Help from another person eating meals?: None Help from another person taking care of personal grooming?: A Little Help from another person toileting, which includes using toliet, bedpan, or urinal?: A Lot Help from another person bathing (including washing, rinsing, drying)?: A Lot Help from another person to put on and taking off regular upper body clothing?: A Little Help from another person to put on and taking off regular lower body clothing?: A Lot 6 Click Score: 16   End of Session Equipment Utilized During Treatment: Rolling walker  Activity Tolerance: Patient tolerated treatment well Patient left: in chair;with call bell/phone within reach;with chair alarm set  OT Visit Diagnosis: Other abnormalities of gait and mobility (R26.89);Unsteadiness on feet (R26.81);Pain Pain - Right/Left: Right Pain - part of body: Hip                Time: [id number] OT Time Calculation (min): 19  min Charges:  OT General Charges $OT Visit: 1 Visit OT Evaluation $OT Eval Moderate Complexity: 1 Mod  Ignacia Palmaathy Anissa Abbs, OTR/L [facility] Pager [phone] Office [phone]     Evette GeorgesLeonard, Kabella Cassidy Eva 06/15/2018, 1:45 PM

## 2018-06-15 NOTE — Progress Notes (Signed)
Patient will discharge to [facility] Anticipated discharge date: 06/15/18 Family notified: Stevphen Meuseenise Cagle, daughter Transportation by: daughter will drive patient.  Nurse to call report to [phone].  CSW signing off.  Abigail ButtsSusan Alonnah Lampkins, LCSWA  Clinical Social Worker

## 2018-06-15 NOTE — Evaluation (Signed)
Physical Therapy Evaluation Patient Details Name: Donna Ballard MRN: [id number] DOB: 11/26/1930 Today's Date: 06/15/2018   History of Present Illness  Pt is an 84 y.o. female admitted 06/12/18  with presyncope and fall; found to be hypoxic and in rapid a-fib. Chest CT suggestive of pulmonary edema with small bilateral pleural effusions. Head CT without acute abnormality; small forehead hematoma. R hip xray without fx. Worked up for NSTEMI; s/p cardiac cath and PCI of LAD 11/25. PMH includes HTN, DM, obesity.     Clinical Impression  Pt presents with an overall decrease in functional mobility secondary to above. PTA, pt mod indep with RW; children live nearby who work during day. Today, pt able to perform transfers and ambulation with min guard for balance. Pt limited by generalized weakness, decreased activity tolerance, and R hip pain; at significant increased risk for falls. Pt would benefit from continued acute PT services to maximize functional mobility and independence prior to d/c with SNF-level therapies.     Follow Up Recommendations SNF;Supervision for mobility/OOB    Equipment Recommendations  None recommended by PT    Recommendations for Other Services       Precautions / Restrictions Precautions Precautions: Fall Restrictions Weight Bearing Restrictions: No      Mobility  Bed Mobility Overal bed mobility: Modified Independent             General bed mobility comments: HOB elevated, increased time and effort  Transfers Overall transfer level: Needs assistance Equipment used: Rolling walker (2 wheeled) Transfers: Sit to/from Stand Sit to Stand: Min guard         General transfer comment: Reliant on momentum and BUE support to power into standing  Ambulation/Gait Ambulation/Gait assistance: Min guard Gait Distance (Feet): (hallway) Assistive device: Rolling walker (2 wheeled) Gait Pattern/deviations: Step-through pattern;Decreased stride  length;Decreased weight shift to right;Antalgic;Trunk flexed Gait velocity: Decreased Gait velocity interpretation: <1.8 ft/sec, indicate of risk for recurrent falls General Gait Details: Slow, labored ambulation with RW and min guard for balance. Pt required intermittent standing rest breaks secondary to fatigue and DOE. C/o R hip pain  Stairs            Wheelchair Mobility    Modified Rankin (Stroke Patients Only)       Balance Overall balance assessment: Needs assistance   Sitting balance-Leahy Scale: Fair Sitting balance - Comments: Difficulty reaching feet sitting EOB     Standing balance-Leahy Scale: Poor Standing balance comment: Reliant on UE support                             Pertinent Vitals/Pain Pain Assessment: No/denies pain    Home Living Family/patient expects to be discharged to:: Private residence Living Arrangements: Alone Available Help at Discharge: Family;Available PRN/intermittently Type of Home: House Home Access: Stairs to enter Entrance Stairs-Rails: Right;Left;Can reach both Entrance Stairs-Number of Steps: 3 Home Layout: One level Home Equipment: Walker - 2 wheels;Grab bars - tub/shower;Shower seat;Toilet riser(3-wheeled walker) Additional Comments: Son lives next door. Multiple children live nearby - all work during day    Prior Function Level of Independence: Independent with assistive device(s)         Comments: Mod indep with 3-wheeled walker. Children drive     Hand Dominance        Extremity/Trunk Assessment   Upper Extremity Assessment Upper Extremity Assessment: Overall WFL for tasks assessed    Lower Extremity Assessment Lower Extremity Assessment: Generalized weakness  Communication   Communication: No difficulties  Cognition Arousal/Alertness: Awake/alert Behavior During Therapy: WFL for tasks assessed/performed Overall Cognitive Status: Impaired/Different from baseline Area of  Impairment: Awareness;Safety/judgement                         Safety/Judgement: Decreased awareness of deficits;Decreased awareness of safety Awareness: Emergent          General Comments General comments (skin integrity, edema, etc.): Son and daughter present, will not be able to provide 24/7 assist if pt to return home    Exercises     Assessment/Plan    PT Assessment Patient needs continued PT services  PT Problem List Decreased strength;Decreased activity tolerance;Decreased balance;Decreased mobility;Decreased safety awareness;Cardiopulmonary status limiting activity       PT Treatment Interventions DME instruction;Gait training;Stair training;Functional mobility training;Therapeutic activities;Therapeutic exercise;Balance training;Neuromuscular re-education;Cognitive remediation;Patient/family education    PT Goals (Current goals can be found in the Care Plan section)  Acute Rehab PT Goals Patient Stated Goal: "Get back home and doing for myself" PT Goal Formulation: With patient Time For Goal Achievement: 06/29/18 Potential to Achieve Goals: Good    Frequency Min 2X/week   Barriers to discharge Decreased caregiver support      Co-evaluation               AM-PAC PT "6 Clicks" Mobility  Outcome Measure Help needed turning from your back to your side while in a flat bed without using bedrails?: A Little Help needed moving from lying on your back to sitting on the side of a flat bed without using bedrails?: A Little Help needed moving to and from a bed to a chair (including a wheelchair)?: A Little Help needed standing up from a chair using your arms (e.g., wheelchair or bedside chair)?: A Little Help needed to walk in hospital room?: A Little Help needed climbing 3-5 steps with a railing? : A Lot 6 Click Score: 17    End of Session Equipment Utilized During Treatment: Gait belt Activity Tolerance: Patient tolerated treatment well Patient left:  in bed;with call bell/phone within reach;with family/visitor present Nurse Communication: Mobility status PT Visit Diagnosis: Other abnormalities of gait and mobility (R26.89);Muscle weakness (generalized) (M62.81)    Time: 4098-1191 PT Time Calculation (min) (ACUTE ONLY): 15 min   Charges:   PT Evaluation $PT Eval Moderate Complexity: 1 Mod        Ina Homes, PT, DPT Acute Rehabilitation Services  Pager [phone] Office [phone]  Malachy Chamber 06/15/2018, 11:58 AM

## 2018-06-15 NOTE — Care Management Important Message (Signed)
Important Message  Patient Details  Name: Donna FlamingVergie Alvira Bouie MRN: [id number] Date of Birth: 06/06/1931   Medicare Important Message Given:  Yes    Oralia RudMegan P Keedan Sample 06/15/2018, 3:53 PM

## 2018-06-15 NOTE — [place] FL2 (Addendum)
[place] MEDICAID FL2 LEVEL OF CARE SCREENING TOOL     IDENTIFICATION  Patient Name: Donna Ballard Birthdate: 01/02/1931 Sex: female Admission Date (Current Location): 06/12/2018  [facility] [place]   Facility and Address:  [facility] [place], [address], [place], [place] [address]      Provider Number: [id number]  Attending Physician Name and Address:  Chrystie NoseHilty, Kenneth C, MD  Relative Name and Phone Number:  Stevphen MeuseDenise Cagle, [phone]    Current Level of Care: Hospital Recommended Level of Care: Skilled Nursing Facility Prior Approval Number:    Date Approved/Denied:   PASRR Number:   [id number]  Discharge Plan: SNF    Current Diagnoses: Patient Active Problem List   Diagnosis Date Noted  . NSTEMI (non-ST elevated myocardial infarction) (HCC) 06/12/2018    Orientation RESPIRATION BLADDER Height & Weight     Self, Time, Situation, Place  Normal Continent Weight: 97.1 kg Height:  5\' 1"  (154.9 cm)  BEHAVIORAL SYMPTOMS/MOOD NEUROLOGICAL BOWEL NUTRITION STATUS      Continent Diet(please see DC summary)  AMBULATORY STATUS COMMUNICATION OF NEEDS Skin   Limited Assist Verbally Normal                       Personal Care Assistance Level of Assistance  Bathing, Feeding, Dressing Bathing Assistance: Limited assistance Feeding assistance: Independent Dressing Assistance: Limited assistance     Functional Limitations Info  Sight, Hearing, Speech Sight Info: Adequate Hearing Info: Adequate Speech Info: Adequate    SPECIAL CARE FACTORS FREQUENCY  PT (By licensed PT), OT (By licensed OT)     PT Frequency: 5x/week OT Frequency: 5x/week            Contractures Contractures Info: Not present    Additional Factors Info  Code Status, Allergies Code Status Info: DNR Allergies Info: Amlodipine, Metformin And Related, Celecoxib, Enalapril Maleate, Estrogens, Furosemide, Hydrochlorothiazide, Irbesartan, Lescol  Fluvastatin, Losartan, Pantoprazole, Trandolapril, Valsartan           Current Medications (06/15/2018):  This is the current hospital active medication list Current Facility-Administered Medications  Medication Dose Route Frequency Provider Last Rate Last Dose  . 0.9 %  sodium chloride infusion  250 mL Intravenous PRN Nahser, Deloris PingPhilip J, MD      . acetaminophen (TYLENOL) tablet 650 mg  650 mg Oral Q4H PRN Nahser, Deloris PingPhilip J, MD   650 mg at 06/14/18 2140  . albuterol (PROVENTIL) (2.5 MG/3ML) 0.083% nebulizer solution 3 mL  3 mL Inhalation Q6H PRN Nahser, Deloris PingPhilip J, MD      . apixaban Everlene Balls(ELIQUIS) tablet 5 mg  5 mg Oral BID Nahser, Deloris PingPhilip J, MD   5 mg at 06/15/18 1104  . atorvastatin (LIPITOR) tablet 40 mg  40 mg Oral q1800 Nahser, Deloris PingPhilip J, MD   40 mg at 06/14/18 1704  . carvedilol (COREG) tablet 25 mg  25 mg Oral BID WC Nahser, Deloris PingPhilip J, MD   25 mg at 06/15/18 0839  . clopidogrel (PLAVIX) tablet 75 mg  75 mg Oral Q breakfast Nahser, Deloris PingPhilip J, MD   75 mg at 06/15/18 0839  . famotidine (PEPCID) tablet 20 mg  20 mg Oral Daily Nahser, Deloris PingPhilip J, MD   20 mg at 06/15/18 1104  . folic acid (FOLVITE) tablet 0.5 mg  500 mcg Oral Daily Nahser, Deloris PingPhilip J, MD   0.5 mg at 06/15/18 1102  . furosemide (LASIX) tablet 40 mg  40 mg Oral Daily Nahser, Deloris PingPhilip J,  MD   40 mg at 06/15/18 1105  . insulin glargine (LANTUS) injection 20 Units  20 Units Subcutaneous Daily Nahser, Deloris Ping, MD   20 Units at 06/15/18 1101  . MEDLINE mouth rinse  15 mL Mouth Rinse BID Nahser, Deloris Ping, MD   15 mL at 06/15/18 1000  . multivitamin with minerals tablet 1 tablet  1 tablet Oral Daily Nahser, Deloris Ping, MD   1 tablet at 06/15/18 1104  . nitroGLYCERIN (NITROSTAT) SL tablet 0.4 mg  0.4 mg Sublingual Q5 Min x 3 PRN Nahser, Deloris Ping, MD      . ondansetron [facility] injection 4 mg  4 mg Intravenous Q6H PRN Nahser, Deloris Ping, MD      . polyethylene glycol (MIRALAX / GLYCOLAX) packet 17 g  17 g Oral Daily Nahser, Deloris Ping, MD   17 g at 06/15/18  1106  . polyvinyl alcohol (LIQUIFILM TEARS) 1.4 % ophthalmic solution 1 drop  1 drop Both Eyes BID Nahser, Deloris Ping, MD   1 drop at 06/15/18 1000  . potassium chloride SA (K-DUR,KLOR-CON) CR tablet 20 mEq  20 mEq Oral Daily Nahser, Deloris Ping, MD   20 mEq at 06/15/18 1105  . sodium chloride flush (NS) 0.9 % injection 3 mL  3 mL Intravenous Q12H Nahser, Deloris Ping, MD   3 mL at 06/15/18 1107  . sodium chloride flush (NS) 0.9 % injection 3 mL  3 mL Intravenous PRN Nahser, Deloris Ping, MD      . traMADol Janean Sark) tablet 50 mg  50 mg Oral BID PRN Nahser, Deloris Ping, MD   50 mg at 06/13/18 2158  . vitamin B-12 (CYANOCOBALAMIN) tablet 500 mcg  500 mcg Oral Daily Nahser, Deloris Ping, MD   500 mcg at 06/15/18 1105     Discharge Medications: Please see discharge summary for a list of discharge medications.  Relevant Imaging Results:  Relevant Lab Results:   Additional Information SSN: [id number]  Abigail Butts, LCSW

## 2018-06-15 NOTE — Progress Notes (Signed)
[phone] Will let PT walk and evaluate for home needs as pt lives alone. Daughter in room. MI education completed with pt and daughter. Reviewed NTG use, stressed importance of plavix with stent, encouraged low sodium and gave diet. Gave Off the Beat book and discussed need for eliquis with atrial fib. Referred to CRP 2 [place] but pt not able to attend at this time and probably not appropriate long term.  Luetta Nuttingharlene Destyn Parfitt RN BSN 06/15/2018 9:22 AM

## 2018-06-15 NOTE — Discharge Summary (Addendum)
Discharge Summary    Patient ID: Donna Ballard MRN: [id number]; DOB: 07/19/1931  Admit date: 06/12/2018 Discharge date: 06/15/2018  Primary Care Provider: Patient, No Pcp Per  Primary Cardiologist: No primary care provider on file.  Will ultimately f/u in [place], but seen by Dr. Elease HashimotoNahser here. (Due to limited 1 week appointment availability [facility]).  Discharge Diagnoses    Principal Problem:   Acute respiratory failure with hypoxia (HCC) Active Problems:   NSTEMI (non-ST elevated myocardial infarction) (HCC)   Acute diastolic CHF (congestive heart failure) (HCC)   Morbid obesity (HCC)   Normocytic anemia   Atrial fibrillation, rapid (HCC)   Hypokalemia   Gait instability   Essential hypertension   Hyperlipidemia   Leukocytosis   CAD in native artery   Allergies Allergies  Allergen Reactions  . Amlodipine Shortness Of Breath  . Metformin And Related Shortness Of Breath  . Celecoxib Diarrhea  . Enalapril Maleate Other (See Comments)    Myalgias   . Estrogens Other (See Comments)    Edema  . Furosemide     Myalgia   . Hydrochlorothiazide Other (See Comments)    Hypercalcemia  . Irbesartan Other (See Comments)    Leg cramps   . Lescol [Fluvastatin] Other (See Comments)    myalgia  . Losartan Other (See Comments)    confusion  . Pantoprazole Other (See Comments)    Arthralgia   . Trandolapril Other (See Comments)    Insomnia   . Valsartan Other (See Comments)    Myalgia, rash, insomnia    Diagnostic Studies/Procedures    Cath 06/13/18 Conclusions: 1. Severe single-vessel coronary artery disease, with 90% mid LAD stenosis and 80-90% D1 and D2 lesions. 2. Moderate, non-obstructive disease involving the proximal and mid LAD, mid LCx, and RCA. 3. Moderately elevated left ventricular filling pressure. 4. Successful PCI to 90% mid LAD stenosis using Resolute Onyx 3.0 x 12 mm drug-eluting stent (post-dilated to 3.4  mm) with 0% residual stenosis and TIMI-3 flow.  Given interposed aneurysmal dilation and non-critical appearance, the more distal 50-60% mid LAD stenosis was not intervened upon.  Recommendations: 1. Aggressive secondary prevention. 2. Medical therapy for diagonal lesions as well as non-obstructive CAD elsewhere in the LAD, LCx, and RCA. 3. Restart heparin infusion 2 hours after deflation of TR band. 4. Continue aspirin and clopidogrel until decision is made regarding long-term anticoagulation.  Would initiation of NOAC and continuation of clopidogrel for up to 12 months.  Aspirin can be discontinued when NOAC is started. 5. Restart IV furosemide tomorrow, as renal function allows. 6. Proceed with transthoracic echocardiogram for evaluation of LVEF.  Yvonne Kendallhristopher End, MD [facility] Pager: [phone]   2D echo 06/14/18 Study Conclusions  - Left ventricle: The cavity size was normal. There was mild   concentric hypertrophy. Systolic function was normal. The   estimated ejection fraction was in the range of 60% to 65%. Wall   motion was normal; there were no regional wall motion   abnormalities. The study was not technically sufficient to allow   evaluation of LV diastolic dysfunction due to atrial   fibrillation. - Aortic valve: Trileaflet; moderately thickened, moderately   calcified leaflets. There was mild stenosis. There was mild   regurgitation. Valve area (VTI): 1.01 cm^2. Valve area (Vmax):   1.05 cm^2. Valve area (Vmean): 0.98 cm^2. - Mitral valve: Calcified annulus. There was mild regurgitation. - Left atrium: The atrium was moderately to severely dilated. -  Right ventricle: Systolic function was mildly reduced. - Right atrium: The atrium was moderately dilated. - Tricuspid valve: There was mild regurgitation.    _____________   History of Present Illness     Donna Ballard is an 20F with h/o gait instability requiring walker, HTN, HLD, IDDM, morbid obesity,  osteoarthritis, DNR order who was admitted with pre-syncope and fall.   Prior to admission the patient lived by herself with a longstanding history of difficulty with her balance requiring a walker. She has had multiple episodes in the past where she has become dizzy and almost fallen, however has never overtly fallen and injured herself. The day prior to admission she got out of bed to go to the bathroom. After standing up from using the bathroom she became very dizzy and lightheaded. She attempted to reach her walker but was unable to do so and ended up falling onto the ground. She did not strike her head or sustain any other significant injury. She denied any prodromal symptoms of chest pain, chest pressure, acute shortness of breath, palpitations before her fall. EMS was called and found the patient to be hypoxemic at home. She was brought to an outside hospital for further evaluation. On arrival to the outside hospital, the patient was started on BiPAP for her hypoxemia. She was found to be in rapid atrial fibrillation, which was a new diagnosis for her at the time.  She was found to have a white blood cell count of 13.7, potassium 2.4, and calcium of 6.2.  Her BNP was elevated at 1930.  Her initial troponin was negative.  She was given 80 mg of IV Lasix and admitted for further management.  She put out over 2 L of urine in response to 1 dose of IV Lasix.  She was weaned off of BiPAP and placed on nasal cannula. Her second troponin came back elevated at 0.57.  She was started on heparin for acute coronary syndrome and transferred to [facility] for further evaluation. Prior to transfer, the patient underwent chest CT scan, which revealed nonspecific groundglass opacities in the bilateral lungs suggestive of pulmonary edema and at least moderate coronary and aortic atherosclerosis.  She further was found to have small bilateral pleural effusions and bibasilar atelectasis.  A head CT was performed and  revealed no evidence of acute intracranial abnormality.  She was found to have a small forehead hematoma without fracture.  A right hip x-ray was performed as well which showed advanced lumbar degenerative spondylosis with no gross displaced fracture or misalignment. On arrival to [facility] she was stable. She did report exertional shortness of breath, orthopnea, and bilateral lower extremity edema over the past several weeks but did not have overt awareness of palpitations. She was admitted to our system for further evaluation.  Hospital Course     1. NSTEMI - peak troponin 1.08 here. She underwent cardiac cath 06/13/18 demonstrating severe single vessel CAD with 90% mid LAD and 80-90% D1 and D2 lesions; she underwent DES to the mid LAD stenosis. She also had moderate, non-obstructive disease involving the proximal and mid LAD, mid LCx, and RCA. Given interposed aneurysmal dilation and non-critical appearance, the more distal 50-60% mid LAD stenosis was not intervened upon. Medical therapy was recommended for residual disease. She was started on ASA, Plavix, and Eliquis. ASA was discontinued after Eliquis was started.  She was also started on statin. Lipid profile does not appear obtained during admission but AST/ALT were normal. If the patient  is tolerating statin at time of follow-up appointment, would consider rechecking liver function/lipid panel in 6-8 weeks.  2. New onset decompensated acute diastolic CHF likely contributing to acute respiratory failure with hypoxia - by cath, she had moderately elevated LV filling pressure. 2D echo 06/14/18 showed mild LVH, EF 60-65%, no RWMA, mild aortic stenosis and regurgitation, mild mitral regurgitation, mild tricuspid regurgitation, moderate-severely dilated left atrium, mildly reduced RV function and moderate RAE. She was continued on Lasix with further improvement in symptoms. I/Os not accurate here. DC weight is 214lb.   3. New onset atrial fibrillation -  CHADSVASC is 7 for CHF, HTN, age, female, vascular disease, DM. She was started on Eliquis 5mg  BID and will need close f/u as OP. Given normal LV function and asymptomatic nature after treatment of the above, Dr. Elease Hashimoto recommended rate control strategy for now without plan for DCCV.  4. Hypokalemia - repleted, improved, recommend repeat at outpatient follow-up  5. Occasional PVCs - noted on telemetry in setting of hypokalemia which was repleted.  6. Leukocytosis - possibly due to stress demargination, no acute signs of infection. Recommend follow-up by primary care.  7. Anemia, normocytic - blood count showed anemia with trend 10.6-11.2-10.2-9.7-9.6. No signs of bleeding. Recommend close outpatient f/u with nursing home by primary medicine, with observation for any bleeding. If not performed prior to cardiology OV can obtain at that time as well.  8. Gait instability/frailty - seen by PT/OT and felt to require SNF. Of note, the patient was on tramadol prior to dc. In setting of her gait instability and dizziness, Dr. Elease Hashimoto did not feel comfortable prescribing this on discharge and recommended her primary care/nursing home team assess in the outpatient setting.  The patient feels much better today. Dr. Elease Hashimoto has seen and examined the patient today and feels she is stable for discharge. Medications listed below are per his recommendations upon discharge. TOC f/u has been arranged 12/5. No availability in the [place] / [place] locations, so this will occur in our [place] office.  _____________  Discharge Vitals Blood pressure (!) 141/64, pulse 68, temperature 98.2 F (36.8 C), temperature source Oral, resp. rate 20, height 5\' 1"  (1.549 m), weight 97.1 kg, SpO2 97 %.  Filed Weights   06/13/18 0500 06/14/18 0600 06/15/18 0456  Weight: 95.9 kg 96.2 kg 97.1 kg    Labs & Radiologic Studies    CBC Recent Labs    06/14/18 0522 06/15/18 0423  WBC 17.4* 14.5*  HGB 9.7* 9.6*  HCT 32.8*  32.0*  MCV 88.4 88.4  PLT 286 249   Basic Metabolic Panel Recent Labs    16/10/96 0258 06/14/18 0522  NA 140 140  K 3.3* 4.5  CL 103 106  CO2 25 26  GLUCOSE 205* 112*  BUN 14 24*  CREATININE 0.89 0.94  CALCIUM 9.0 8.8*   Liver Function Tests Recent Labs    06/12/18 2303  AST 26  ALT 16  ALKPHOS 62  BILITOT 1.5*  PROT 6.9  ALBUMIN 3.5   No results for input(s): LIPASE, AMYLASE in the last 72 hours. Cardiac Enzymes Recent Labs    06/12/18 2303 06/13/18 0258  TROPONINI 0.91* 1.08*   BNP Invalid input(s): POCBNP D-Dimer No results for input(s): DDIMER in the last 72 hours. Hemoglobin A1C No results for input(s): HGBA1C in the last 72 hours. Fasting Lipid Panel No results for input(s): CHOL, HDL, LDLCALC, TRIG, CHOLHDL, LDLDIRECT in the last 72 hours. Thyroid Function Tests No results for input(s):  TSH, T4TOTAL, T3FREE, THYROIDAB in the last 72 hours.  Invalid input(s): FREET3 _____________  Dg Chest Port 1 View  Result Date: 06/12/2018 CLINICAL DATA:  Shortness of breath.  Fall this morning at home. EXAM: PORTABLE CHEST 1 VIEW COMPARISON:  Radiographs and chest CT earlier this day at [facility]. FINDINGS: Improving pulmonary edema. Mild cardiomegaly is unchanged. Aortic atherosclerosis. Pleural effusions on CT are not as well seen radiographically. No new focal airspace disease. No pneumothorax. Right shoulder arthroplasty again seen. IMPRESSION: Improving pulmonary edema since radiograph earlier this day. Pleural effusions on chest CT are not well seen radiographically. Aortic Atherosclerosis (ICD10-I70.0). Electronically Signed   By: Narda Rutherford M.D.   On: 06/12/2018 23:37   Disposition   Pt is being discharged home today in good condition.  Follow-up Plans & Appointments     Contact information for follow-up providers    Barrett, Joline Salt, PA-C Follow up.   Specialties:  Cardiology, Radiology Why:  See appointment information below for  appointment. There were no appointments available in [place] for 1 week so you'll come to our [place] location for first post-hospital visit. Bjorn Loser is one of the PAs that works closely with the cardiology team. Contact information: [address] [place] [address] [phone]            Contact information for after-discharge care    Destination    [facility] .   Service:  Skilled Nursing Contact information: [address] [place] [address] [phone]                 Discharge Instructions    AMB Referral to Cardiac Rehabilitation - Phase II   Complete by:  As directed    Diagnosis:   NSTEMI Coronary Stents     Diet - low sodium heart healthy   Complete by:  As directed    Discharge instructions   Complete by:  As directed    For patients with congestive heart failure, we give them these special instructions:  1. Follow a low-salt diet - you are allowed no more than 2,000mg  of sodium per day. Watch your fluid intake. In general, you should not be taking in more than 2 liters of fluid per day (no more than 8 glasses per day). This includes sources of water in foods like soup, coffee, tea, milk, etc. 2. Weigh yourself on the same scale at same time of day and keep a log. 3. Call your doctor: (Anytime you feel any of the following symptoms)  - 3lb weight gain overnight or 5lb within a few days - Shortness of breath, with or without a dry hacking cough  - Swelling in the hands, feet or stomach  - If you have to sleep on extra pillows at night in order to breathe   IT IS IMPORTANT TO LET YOUR DOCTOR KNOW EARLY ON IF YOU ARE HAVING SYMPTOMS SO WE CAN HELP YOU!   Increase activity slowly   Complete by:  As directed    No driving for now. No lifting over 10 lbs for 2 weeks. No sexual activity for 2 weeks.Marland Kitchen Keep cath procedure site clean & dry. If you notice increased pain, swelling, bleeding or pus,  call/return!  You may shower, but no soaking baths/hot tubs/pools for 1 week.   If you notice any bleeding such as blood in stool, black tarry stools, blood in urine, nosebleeds or any other unusual bleeding, call your doctor immediately.  Discharge Medications   Allergies as of 06/15/2018      Reactions   Amlodipine Shortness Of Breath   Metformin And Related Shortness Of Breath   Celecoxib Diarrhea   Enalapril Maleate Other (See Comments)   Myalgias   Estrogens Other (See Comments)   Edema   Furosemide    Myalgia   Hydrochlorothiazide Other (See Comments)   Hypercalcemia   Irbesartan Other (See Comments)   Leg cramps   Lescol [fluvastatin] Other (See Comments)   myalgia   Losartan Other (See Comments)   confusion   Pantoprazole Other (See Comments)   Arthralgia   Trandolapril Other (See Comments)   Insomnia   Valsartan Other (See Comments)   Myalgia, rash, insomnia      Medication List    STOP taking these medications   aspirin 81 MG chewable tablet   Fish Oil 1200 MG Caps   torsemide 20 MG tablet Commonly known as:  DEMADEX   traMADol 50 MG tablet Commonly known as:  ULTRAM     TAKE these medications   albuterol 108 (90 Base) MCG/ACT inhaler Commonly known as:  PROVENTIL HFA;VENTOLIN HFA Inhale 2 puffs into the lungs every 6 (six) hours as needed for wheezing or shortness of breath.   apixaban 5 MG Tabs tablet Commonly known as:  ELIQUIS Take 1 tablet (5 mg total) by mouth 2 (two) times daily.   atorvastatin 40 MG tablet Commonly known as:  LIPITOR Take 1 tablet (40 mg total) by mouth every evening.   carvedilol 25 MG tablet Commonly known as:  COREG Take 25 mg by mouth 2 (two) times daily with a meal.   clopidogrel 75 MG tablet Commonly known as:  PLAVIX Take 1 tablet (75 mg total) by mouth daily.   ergocalciferol 1.25 MG (50000 UT) capsule Commonly known as:  VITAMIN D2 Take 50,000 Units by mouth every Friday.   folic acid 800 MCG  tablet Commonly known as:  FOLVITE Take 800 mcg by mouth daily.   furosemide 40 MG tablet Commonly known as:  LASIX Take 1 tablet (40 mg total) by mouth daily. Start taking on:  06/16/2018   insulin glargine 100 UNIT/ML injection Commonly known as:  LANTUS Inject 20 Units into the skin daily.   nitroGLYCERIN 0.4 MG SL tablet Commonly known as:  NITROSTAT Place 0.4 mg under the tongue every 5 (five) minutes as needed for chest pain.   polyethylene glycol packet Commonly known as:  MIRALAX / GLYCOLAX Take 17 g by mouth daily.   potassium chloride SA 20 MEQ tablet Commonly known as:  K-DUR,KLOR-CON Take 1 tablet (20 mEq total) by mouth daily. Start taking on:  06/16/2018   PRESERVISION AREDS PO Take 1 capsule by mouth every morning.   ranitidine 150 MG tablet Commonly known as:  ZANTAC Take 150 mg by mouth 2 (two) times daily.   SYSTANE BALANCE OP Apply 1 drop to eye 2 (two) times daily.   vitamin B-12 500 MCG tablet Commonly known as:  CYANOCOBALAMIN Take 500 mcg by mouth daily.        Acute coronary syndrome (MI, NSTEMI, STEMI, etc) this admission?: Yes.     AHA/ACC Clinical Performance & Quality Measures: 7. Aspirin prescribed? - No - on Plavix and Eliquis; bleeding risk 8. ADP Receptor Inhibitor (Plavix/Clopidogrel, Brilinta/Ticagrelor or Effient/Prasugrel) prescribed (includes medically managed patients)? - Yes 9. Beta Blocker prescribed? - Yes 10. High Intensity Statin (Lipitor 40-80mg  or Crestor 20-40mg ) prescribed? - Yes 11. EF assessed during THIS  hospitalization? - Yes 12. For EF <40%, was ACEI/ARB prescribed? - Not Applicable (EF >/= 40%) 13. For EF <40%, Aldosterone Antagonist (Spironolactone or Eplerenone) prescribed? - Not Applicable (EF >/= 40%) 14. Cardiac Rehab Phase II ordered (Included Medically managed Patients)? - Yes but not able to attend and not likely appropriate long term     Outstanding Labs/Studies   Recommend BMET/CBC either at  nursing home facility or cardiology office in follow-up  Duration of Discharge Encounter   Greater than 30 minutes including physician time.  Signed, Laurann Montana, PA-C 06/15/2018, 3:47 PM   Attending Note:   The patient was seen and examined.  Agree with assessment and plan as noted above.  Changes made to the above note as needed.  Patient seen and independently examined with Ronie Spies, PA .   We discussed all aspects of the encounter. I agree with the assessment and plan as stated above.  1.  Syncope: I suspect that her episode of syncope was due to her atrial fibrillation.  She did not have any devious pauses.  We have elected to proceed with a rate control and anticoagulation strategy.  2.  Coronary artery disease: She had a troponin bump when she was admitted.  She also had significant shortness of breath.  Heart catheterization revealed a very tight mid LAD stenosis which was stented.  Her breathing improved quite a bit following that procedure.  3.  Atrial fibrillation: She was started on Eliquis 5 mg twice a day.  She will need to be very careful to make sure that she does not lose her balance. Because of her fall she was seen by physical therapy and Occupational Therapy.  It was recommended that she go to a skilled nursing facility for now.   I have spent a total of 40 minutes with patient reviewing hospital  notes , telemetry, EKGs, labs and examining patient as well as establishing an assessment and plan that was discussed with the patient. > 50% of time was spent in direct patient care.    Vesta Mixer, Montez Hageman., MD, [facility] 06/15/2018, 5:52 PM [address],  [address] Office [phone] Pager [phone]

## 2018-06-23 ENCOUNTER — Ambulatory Visit (INDEPENDENT_AMBULATORY_CARE_PROVIDER_SITE_OTHER): Payer: Medicare Other | Admitting: Physician Assistant

## 2018-06-23 ENCOUNTER — Encounter: Payer: Self-pay | Admitting: Physician Assistant

## 2018-06-23 VITALS — BP 130/59 | HR 61 | Ht 61.0 in | Wt 220.2 lb

## 2018-06-23 DIAGNOSIS — E876 Hypokalemia: Secondary | ICD-10-CM | POA: Diagnosis not present

## 2018-06-23 DIAGNOSIS — E669 Obesity, unspecified: Secondary | ICD-10-CM

## 2018-06-23 DIAGNOSIS — I5033 Acute on chronic diastolic (congestive) heart failure: Secondary | ICD-10-CM | POA: Diagnosis not present

## 2018-06-23 DIAGNOSIS — D649 Anemia, unspecified: Secondary | ICD-10-CM

## 2018-06-23 LAB — BASIC METABOLIC PANEL
BUN/Creatinine Ratio: 19 (ref 12–28)
BUN: 13 mg/dL (ref 8–27)
CALCIUM: 9.1 mg/dL (ref 8.7–10.3)
CHLORIDE: 104 mmol/L (ref 96–106)
CO2: 22 mmol/L (ref 20–29)
Creatinine, Ser: 0.67 mg/dL (ref 0.57–1.00)
GFR calc Af Amer: 93 mL/min/{1.73_m2} (ref >59)
GFR, EST NON AFRICAN AMERICAN: 80 mL/min/{1.73_m2} (ref >59)
GLUCOSE: 142 mg/dL — AB (ref 65–99)
POTASSIUM: 4.7 mmol/L (ref 3.5–5.2)
Sodium: 140 mmol/L (ref 134–144)

## 2018-06-23 MED ORDER — POTASSIUM CHLORIDE CRYS ER 20 MEQ PO TBCR: EXTENDED_RELEASE_TABLET | ORAL | 6 refills | Status: DC

## 2018-06-23 MED ORDER — FUROSEMIDE 40 MG PO TABS: ORAL_TABLET | ORAL | 6 refills | Status: DC

## 2018-06-23 NOTE — Progress Notes (Signed)
Cardiology Office Note   Date:  06/23/2018   ID:  Donna DoppOpal Mariner, DOB 07/25/1930, MRN [id number]  PCP:  Everlean CherryWhyte, Thomas M, MD Cardiologist:   Will f/u in [place] with Dr. Dulce SellarMunley at patient request Theodore Demarkhonda Barrett, PA-C   Chief Complaint  Patient presents with  . Hospitalization Follow-up    numbness in face    History of Present Illness:  Donna Ballard is a 84 y.o. female with a history of Gait instability requiring a walker, DM, HTN, HLD, OA, DNR, morbid obesity  Admitted 11/24-11/27/2019 after a fall and syncope.  Acute respiratory failure with hypoxia, NSTEMI, D-CHF, Afib RVR, anemia. DES LAD w/ med rx for 90% D1 & D2, 50-60% dLAD, ASA, Plavix but ASA d/c'd after Eliquis started, statin added, recheck lipid/liver 6-8 weeks, d/c wt 214 lbs, CHA2DS2-VASc = 7 (age x 2, female, CHF, HTN, CAD, DM). D/C to SNF for rehab  [facility].  Her daughter is with her, she helps in her care  Her vision has changed, she is not able to read unless she holds it very close and uses a bright light.  She is not sure if this happened after the fall or not.  Her daughter states that she had an eye doctor appointment a few months ago and was doing well.  She gets SOB w/ activity. However, she was able to walk 256 feet without stopping yesterday w/ PT. Other than working with PT, she is not doing very much.  She has not had any bleeding issues from the meds. However, the bruising from the fall is resolving very, very slowly.   Her main complaint is some paresthesias around the upper part of her face that started after the fall, it is not painful, but it bothers her.  No chest pain.   [place] do [place] in [place], Ms. Botts has seen Dr. Dulce SellarMunley in the past.   Past Medical History:  Diagnosis Date  . Arthritis   . Atrial fibrillation (HCC)    a. dx 05/2018, rate control strategy for now.  Marland Kitchen. CAD in native artery    a. NSTEMI 05/2018 s/p DES to LAD with residual dz  treated medically.  . Chronic bronchitis (HCC)   . Chronic diastolic CHF (congestive heart failure) (HCC)   . Diabetes mellitus without complication (HCC)   . Frailty   . Gait instability   . GERD (gastroesophageal reflux disease)   . HLD (hyperlipidemia)   . HTN (hypertension)   . Hypertension   . Hypokalemia   . Insulin dependent diabetes mellitus (HCC)   . Muscle pain   . Normocytic anemia   . Osteoarthritis   . PVC's (premature ventricular contractions)   . Swelling     Past Surgical History:  Procedure Laterality Date  . ABDOMINAL HYSTERECTOMY    . CHOLECYSTECTOMY    . CORONARY STENT INTERVENTION N/A 06/13/2018   Procedure: CORONARY STENT INTERVENTION;  Surgeon: Yvonne KendallEnd, Christopher, MD;  Location: [facility];  Service: Cardiovascular;  Laterality: N/A;  . LEFT HEART CATH AND CORONARY ANGIOGRAPHY N/A 06/13/2018   Procedure: LEFT HEART CATH AND CORONARY ANGIOGRAPHY;  Surgeon: Yvonne KendallEnd, Christopher, MD;  Location: [facility]AS[facility] CV [facility];  Service: Cardiovascular;  Laterality: N/A;  . parithyroid surgery     09/2013  . TONSILLECTOMY      Current Outpatient Medications  Medication Sig Dispense Refill  . albuterol (PROVENTIL HFA;VENTOLIN HFA) 108 (90 Base) MCG/ACT inhaler Inhale 2 puffs into  the lungs every 6 (six) hours as needed for wheezing or shortness of breath.    Marland Kitchen apixaban (ELIQUIS) 5 MG TABS tablet Take 1 tablet (5 mg total) by mouth 2 (two) times daily. 60 tablet 6  . atorvastatin (LIPITOR) 40 MG tablet Take 1 tablet (40 mg total) by mouth every evening. 30 tablet 6  . carvedilol (COREG) 25 MG tablet Take 25 mg by mouth 2 (two) times daily with a meal.    . clopidogrel (PLAVIX) 75 MG tablet Take 1 tablet (75 mg total) by mouth daily. 30 tablet 6  . ergocalciferol (VITAMIN D2) 1.25 MG (50000 UT) capsule Take 50,000 Units by mouth every Friday.     . folic acid (FOLVITE) 800 MCG tablet Take 800 mcg by mouth daily.     . furosemide (LASIX) 40 MG tablet Take 1 tablet (40 mg  total) by mouth daily. 30 tablet 6  . glucose blood test strip 1 each by Other route as needed for other. Use as instructed    . insulin glargine (LANTUS) 100 UNIT/ML injection Inject 20 Units into the skin daily.     Marland Kitchen MICARDIS 80 MG tablet     . Multiple Vitamins-Minerals (PRESERVISION AREDS PO) Take 1 capsule by mouth every morning.    . nitroGLYCERIN (NITROSTAT) 0.4 MG SL tablet Place 0.4 mg under the tongue every 5 (five) minutes as needed for chest pain.    . nitroGLYCERIN (NITROSTAT) 0.4 MG SL tablet Place 0.4 mg under the tongue every 5 (five) minutes as needed for chest pain.    . polyethylene glycol (MIRALAX / GLYCOLAX) packet Take 17 g by mouth daily.    . potassium chloride SA (K-DUR,KLOR-CON) 20 MEQ tablet Take 1 tablet (20 mEq total) by mouth daily. 30 tablet 6  . Propylene Glycol (SYSTANE BALANCE OP) Apply 1 drop to eye 2 (two) times daily.    . ranitidine (ZANTAC) 150 MG tablet Take 150 mg by mouth 2 (two) times daily.    Marland Kitchen torsemide (DEMADEX) 20 MG tablet Take 20 mg by mouth daily.    . traMADol (ULTRAM) 50 MG tablet Take by mouth every 6 (six) hours as needed.    . vitamin B-12 (CYANOCOBALAMIN) 500 MCG tablet Take 500 mcg by mouth daily.     No current facility-administered medications for this visit.     Allergies:   Amlodipine; Amlodipine besylate; Metformin; Metformin and related; Celecoxib; Enalapril maleate; Estrogens; Fluvastatin; Furosemide; Hydrochlorothiazide; Irbesartan; Lisinopril; Losartan; Pantoprazole; Pantoprazole sodium; Trandolapril; and Valsartan    Social History:  The patient  reports that she has never smoked. She has never used smokeless tobacco. She reports that she does not drink alcohol or use drugs.   Family History:  The patient's family history is not on file.  She indicated that her mother is deceased. She indicated that her father is deceased.    ROS:  Please see the history of present illness. All other systems are reviewed and negative.     PHYSICAL EXAM: VS:  BP (!) 130/59   Pulse 61   Ht 5\' 1"  (1.549 m)   Wt 220 lb 3.2 oz (99.9 kg)   SpO2 99%   BMI 41.61 kg/m  , BMI Body mass index is 41.61 kg/m. GEN: Well developed, elderly female in no acute distress HEENT: normal for age  Neck: JVD 9 cm, no carotid bruit, no masses Cardiac: RRR; no murmur, no rubs, or gallops Respiratory: Decreased breath sounds bases bilaterally, normal work of breathing GI:  soft, nontender, nondistended, + BS MS: no deformity or atrophy; 2+ lower extremity edema; distal pulses are 2+ in all 4 extremities  Skin: warm and dry, no rash, lesions are noted on her legs that are healing, no weeping, multiple areas of resolving ecchymosis on her face and arms Neuro:  Strength and sensation are intact Psych: euthymic mood, full affect   EKG:  EKG is not ordered today.    Cath 06/13/18 Conclusions: 1. Severe single-vessel coronary artery disease, with 90% mid LAD stenosis and 80-90% D1 and D2 lesions. 2. Moderate, non-obstructive disease involving the proximal and mid LAD, mid LCx, and RCA. 3. Moderately elevated left ventricular filling pressure. 4. Successful PCI to 90% mid LAD stenosis using Resolute Onyx 3.0 x 12 mm drug-eluting stent (post-dilated to 3.4 mm) with 0% residual stenosis and TIMI-3 flow. Given interposed aneurysmal dilation and non-critical appearance, the more distal 50-60% mid LAD stenosis was not intervened upon.  Recommendations: 1. Aggressive secondary prevention. 2. Medical therapy for diagonal lesions as well as non-obstructive CAD elsewhere in the LAD, LCx, and RCA. 3. Restart heparin infusion 2 hours after deflation of TR band. 4. Continue aspirin and clopidogrel until decision is made regarding long-term anticoagulation. Would initiation of NOAC and continuation of clopidogrel for up to 12 months. Aspirin can be discontinued when NOAC is started. 5. Restart IV furosemide tomorrow, as renal function  allows. 6. Proceed with transthoracic echocardiogram for evaluation of LVEF.  Yvonne Kendall, MD [facility] Pager: [phone]   2D echo 06/14/18 Study Conclusions  - Left ventricle: The cavity size was normal. There was mild concentric hypertrophy. Systolic function was normal. The estimated ejection fraction was in the range of 60% to 65%. Wall motion was normal; there were no regional wall motion abnormalities. The study was not technically sufficient to allow evaluation of LV diastolic dysfunction due to atrial fibrillation. - Aortic valve: Trileaflet; moderately thickened, moderately calcified leaflets. There was mild stenosis. There was mild regurgitation. Valve area (VTI): 1.01 cm^2. Valve area (Vmax): 1.05 cm^2. Valve area (Vmean): 0.98 cm^2. - Mitral valve: Calcified annulus. There was mild regurgitation. - Left atrium: The atrium was moderately to severely dilated. - Right ventricle: Systolic function was mildly reduced. - Right atrium: The atrium was moderately dilated. - Tricuspid valve: There was mild regurgitation.     Recent Labs: 06/12/2018: ALT 16; B Natriuretic Peptide 541.7 06/14/2018: BUN 24; Creatinine, Ser 0.94; Potassium 4.5; Sodium 140 06/15/2018: Hemoglobin 9.6; Platelets 249  CBC    Component Value Date/Time   WBC 14.5 (H) 06/15/2018 0423   RBC 3.62 (L) 06/15/2018 0423   HGB 9.6 (L) 06/15/2018 0423   HCT 32.0 (L) 06/15/2018 0423   PLT 249 06/15/2018 0423   MCV 88.4 06/15/2018 0423   MCH 26.5 06/15/2018 0423   MCHC 30.0 06/15/2018 0423   RDW 15.3 06/15/2018 0423   CMP Latest Ref Rng & Units 06/14/2018 06/13/2018 06/12/2018  Glucose 70 - 99 mg/dL 098(J) 191(Y) 782(N)  BUN 8 - 23 mg/dL 56(O) 14 12  Creatinine 0.44 - 1.00 mg/dL 1.30 8.65 7.84  Sodium 135 - 145 mmol/L 140 140 137  Potassium 3.5 - 5.1 mmol/L 4.5 3.3(L) 3.5  Chloride 98 - 111 mmol/L 106 103 102  CO2 22 - 32 mmol/L 26 25 21(L)  Calcium 8.9 - 10.3  mg/dL 6.9(G) 9.0 9.2  Total Protein 6.5 - 8.1 g/dL - - 6.9  Total Bilirubin 0.3 - 1.2 mg/dL - - 1.5(H)  Alkaline Phos 38 - 126  U/L - - 62  AST 15 - 41 U/L - - 26  ALT 0 - 44 U/L - - 16     Lipid Panel No results found for: CHOL, HDL, LDLCALC, LDLDIRECT, TRIG, CHOLHDL    Wt Readings from Last 3 Encounters:  06/23/18 220 lb 3.2 oz (99.9 kg)  06/15/18 214 lb (97.1 kg)     Other studies Reviewed: Additional studies/ records that were reviewed today include: Office notes, hospital records and testing.  ASSESSMENT AND PLAN:  1.  Acute on chronic diastolic CHF: -Her weight is trending up and she has lower extremity edema.  Neither she or her daughter cannot say if it is worse than it has been.  We have no weights from the facility. -Have sent a message back to the facility that the patient should have daily weights, 2 g sodium diet - Increase Lasix to 80 mg every morning and increase potassium as well for 3 days, then resume previous dosing. - Daily weights and fax them to [place] every 2 weeks so that we can review - Call for weight gain of 3 pounds in a day or 5 pounds in a week  2.  Obesity: The patient states she is eating very well in the facility - I explained to the daughter and the patient that she would have fewer mobility issues if she weighed less. - She is to focus on healthier foods  3.  Hypokalemia: She had problems with this in the hospital with diuresis -Check a BMET today.  4.  Anemia: - She is not having ongoing bleeding issues. -Her bruising is resolving, albeit slowly -I explained that her bruising will resolve more slowly because of being on Eliquis and Plavix, they understand. -Management per Dr. Martha Clan   Current medicines are reviewed at length with the patient today.  The patient has concerns regarding medicines.  Concerns were addressed  The following changes have been made: Increase Lasix for 3 days  Labs/ tests ordered today include:   Orders Placed  This Encounter  Procedures  . Basic metabolic panel     Disposition:   FU with Dr. Dulce Sellar  Signed, Theodore Demark, PA-C  06/23/2018 12:19 PM    [facility] Phone: [phone]; Fax: [phone]

## 2018-06-23 NOTE — Patient Instructions (Signed)
Medication Instructions:  Increase Lasix to 80 mg for 3 days; then go back to  40 mg daily. Increase Potassium to 2 tablets for 3 days; then go back to 1 tablet daily. If you need a refill on your cardiac medications before your next appointment, please call your pharmacy.   Lab work: BMET If you have labs (blood work) drawn today and your tests are completely normal, you will receive your results only by: Marland Kitchen. MyChart Message (if you have MyChart) OR . A paper copy in the mail If you have any lab test that is abnormal or we need to change your treatment, we will call you to review the results.  Testing/Procedures: None Ordered.  Follow-Up: At [facility], you and your health needs are our priority.  As part of our continuing mission to provide you with exceptional heart care, we have created designated Provider Care Teams.  These Care Teams include your primary Cardiologist (physician) and Advanced Practice Providers (APPs -  Physician Assistants and Nurse Practitioners) who all work together to provide you with the care you need, when you need it. You will need a follow up appointment at first available.  You may see Dr.Munley or another member of our [facility] Provider Team in [place]: Gypsy Balsamobert Krasowski, MD . Belva Cromeajan Revankar, MD  Any Other Special Instructions Will Be Listed Below (If Applicable). Continue a low sodium diet 2 grams daily.

## 2018-07-06 DIAGNOSIS — I340 Nonrheumatic mitral (valve) insufficiency: Secondary | ICD-10-CM

## 2018-07-06 DIAGNOSIS — I350 Nonrheumatic aortic (valve) stenosis: Secondary | ICD-10-CM

## 2018-07-06 DIAGNOSIS — I517 Cardiomegaly: Secondary | ICD-10-CM

## 2018-07-06 DIAGNOSIS — Z7901 Long term (current) use of anticoagulants: Secondary | ICD-10-CM | POA: Insufficient documentation

## 2018-07-06 HISTORY — DX: Cardiomegaly: I51.7

## 2018-07-06 HISTORY — DX: Nonrheumatic mitral (valve) insufficiency: I34.0

## 2018-07-06 HISTORY — DX: Nonrheumatic aortic (valve) stenosis: I35.0

## 2018-07-06 HISTORY — DX: Long term (current) use of anticoagulants: Z79.01

## 2018-07-06 NOTE — Progress Notes (Signed)
Cardiology Office Note:    Date:  07/07/2018   ID:  Donna Ballard, DOB [birth year], MRN [id number]  PCP:  Everlean Cherry, MD  Cardiologist:  Norman Herrlich, MD    Referring MD: Everlean Cherry, MD    ASSESSMENT:    1. Hypertensive heart disease with chronic diastolic congestive heart failure (HCC)   2. NSTEMI (non-ST elevated myocardial infarction) (HCC)   3. Coronary artery disease involving native coronary artery of native heart with unstable angina pectoris (HCC)   4. Chronic anticoagulation   5. Hyperlipidemia, unspecified hyperlipidemia type   6. Normocytic anemia   7. Gait instability   8. Nonrheumatic aortic valve stenosis   9. Atrial enlargement, bilateral   10. Nonrheumatic mitral valve regurgitation    PLAN:    In order of problems listed above:  1. Unfortunately she is decompensated has marked fluid overload if she does not respond to intensive outpatient diuretic will require admission to the hospital.  The family will sodium restricted begin to weigh daily and we will increase the dose of her diuretic and switch from furosemide to torsemide.  Today check renal function proBNP and a CBC as she looks very pale and anemia may be progressive and complicating her heart failure also see if I get [facility] specialty heart failure program to begin to visit and we can measure lung water at home 2. Stable type II secondary to rapid A. fib and heart failure and I would not pursue an invasive evaluation and revascularization at this time.  Continue medical treatment and reduce her statin dose with muscle complaints 3. Continue her current anticoagulant should quickly check a CBC 4. Complaining of muscle symptoms reduce dose 50% she will take her Lipitor every other day 5. Aortic stenosis is not severe would not benefit from surgical or percutaneous valve replacement 6. Secondary functional mitral regurgitation due to heart failure.    Next appointment: 1 to 2  weeks   Medication Adjustments/Labs and Tests Ordered: Current medicines are reviewed at length with the patient today.  Concerns regarding medicines are outlined above.  No orders of the defined types were placed in this encounter.  No orders of the defined types were placed in this encounter.   Chief Complaint  Patient presents with  . Follow-up  . Coronary Artery Disease  . Atrial Fibrillation  . Congestive Heart Failure  . Anemia    History of Present Illness:    Donna Ballard is a 84 y.o. female with a hx of CAD recent non-ST segment elevation MI with PCI and stent LAD stenosis last seen at the time of hospital discharge 06/12/2018 Dr. Rennis Golden.  Other problems included acute diastolic heart failure rapid atrial fibrillation hypertension hyperlipidemia and hypo-kalemia.  Other studies performed including an echocardiogram which showed the presence of mild aortic valve stenosis mild mitral regurgitation moderate to severe left atrial enlargement moderate right atrial enlargement.  She remained in atrial fibrillation with rate control with cardioversion.  Hypokalemia was repleted frequent PVCs improved and anemia was stable with hemoglobins ranging between 9.6 and 10.6 at the time of hospital discharge because of gait instability and frailty she is seen by physical therapy and Occupational Therapy.  She was last seen by me [facility] regional cardiology 03/21/2015 at that time she had stable CAD and hypertension.    Compliance with diet, lifestyle and medications: Yes  She was discharged from rehab and now is cared for by her daughter and son at home.  Unfortunately her  perception was she was doing well she has profound edema greater than 4+ to her thighs to her waist she is short of breath with any activities and she is having orthopnea and PND.  She has had no chest pain she has marked pallor but has had no obvious bleeding.  She has home health coming out but is not the heart failure program.   Working to increase her diuretic and switch from furosemide to torsemide check a CBC as she looks very anemic and had progressive drop in her hemoglobin during hospitalization and I told her daughter she worsen she needs to be brought to the hospital as I think unfortunately she has markedly decompensated heart failure.  I looked at my old records from 2017 and the atrial fibrillation is new.  There is no attempt to resume sinus rhythm during the hospitalization Past Medical History:  Diagnosis Date  . Arthritis   . Atrial fibrillation (HCC)    a. dx 05/2018, rate control strategy for now.  Marland Kitchen. CAD in native artery    a. NSTEMI 05/2018 s/p DES to LAD with residual dz treated medically.  . Chronic bronchitis (HCC)   . Chronic diastolic CHF (congestive heart failure) (HCC)   . Diabetes mellitus without complication (HCC)   . Frailty   . Gait instability   . GERD (gastroesophageal reflux disease)   . HLD (hyperlipidemia)   . HTN (hypertension)   . Hypertension   . Hypokalemia   . Insulin dependent diabetes mellitus (HCC)   . Muscle pain   . Normocytic anemia   . Osteoarthritis   . PVC's (premature ventricular contractions)   . Swelling     Past Surgical History:  Procedure Laterality Date  . ABDOMINAL HYSTERECTOMY    . CARDIAC CATHETERIZATION    . CHOLECYSTECTOMY    . CORONARY ANGIOPLASTY    . CORONARY STENT INTERVENTION N/A 06/13/2018   Procedure: CORONARY STENT INTERVENTION;  Surgeon: Yvonne KendallEnd, Christopher, MD;  Location: [facility] INVASIVE CV LAB;  Service: Cardiovascular;  Laterality: N/A;  . LEFT HEART CATH AND CORONARY ANGIOGRAPHY N/A 06/13/2018   Procedure: LEFT HEART CATH AND CORONARY ANGIOGRAPHY;  Surgeon: Yvonne KendallEnd, Christopher, MD;  Location: [facility] INVASIVE CV LAB;  Service: Cardiovascular;  Laterality: N/A;  . parithyroid surgery     09/2013  . TONSILLECTOMY      Current Medications: Current Meds  Medication Sig  . acetaminophen (TYLENOL) 500 MG tablet Take 500 mg by mouth daily as  needed.  Marland Kitchen. albuterol (PROVENTIL HFA;VENTOLIN HFA) 108 (90 Base) MCG/ACT inhaler Inhale 2 puffs into the lungs every 6 (six) hours as needed for wheezing or shortness of breath.  Marland Kitchen. apixaban (ELIQUIS) 5 MG TABS tablet Take 1 tablet (5 mg total) by mouth 2 (two) times daily.  Marland Kitchen. atorvastatin (LIPITOR) 40 MG tablet Take 1 tablet (40 mg total) by mouth every evening.  . carvedilol (COREG) 25 MG tablet Take 25 mg by mouth 2 (two) times daily with a meal.  . cholecalciferol (VITAMIN D3) 25 MCG (1000 UT) tablet Take 1,000 Units by mouth daily.  . clopidogrel (PLAVIX) 75 MG tablet Take 1 tablet (75 mg total) by mouth daily.  . folic acid (FOLVITE) 800 MCG tablet Take 800 mcg by mouth daily.   . furosemide (LASIX) 40 MG tablet Take 80 mg for 3 days (12/5,12/6,12/7) then back to 40 mg daily.  Marland Kitchen. glucose blood test strip 1 each by Other route as needed for other. Use as instructed  . insulin glargine (LANTUS) 100  UNIT/ML injection Inject 20 Units into the skin daily.   . Multiple Vitamins-Minerals (PRESERVISION AREDS PO) Take 1 capsule by mouth every morning.  . nitroGLYCERIN (NITROSTAT) 0.4 MG SL tablet Place 0.4 mg under the tongue every 5 (five) minutes as needed for chest pain.  . polyethylene glycol (MIRALAX / GLYCOLAX) packet Take 17 g by mouth daily.  . potassium chloride SA (K-DUR,KLOR-CON) 20 MEQ tablet Take 2 tablets for 3 days (12/5,12/6,12/7) then back to 1 tablet daily.  Marland Kitchen Propylene Glycol (SYSTANE BALANCE OP) Apply 1 drop to eye 2 (two) times daily.  . ranitidine (ZANTAC) 150 MG tablet Take 150 mg by mouth 2 (two) times daily.  . vitamin B-12 (CYANOCOBALAMIN) 500 MCG tablet Take 500 mcg by mouth daily.     Allergies:   Amlodipine besylate; Metformin; Celecoxib; Enalapril maleate; Estrogens; Fluvastatin; Furosemide; Hydrochlorothiazide; Irbesartan; Lisinopril; Losartan; Pantoprazole; Trandolapril; and Valsartan   Social History   Socioeconomic History  . Marital status: Widowed    Spouse  name: Not on file  . Number of children: Not on file  . Years of education: Not on file  . Highest education level: Not on file  Occupational History  . Not on file  Social Needs  . Financial resource strain: Not on file  . Food insecurity:    Worry: Not on file    Inability: Not on file  . Transportation needs:    Medical: Not on file    Non-medical: Not on file  Tobacco Use  . Smoking status: Never Smoker  . Smokeless tobacco: Never Used  Substance and Sexual Activity  . Alcohol use: No  . Drug use: No  . Sexual activity: Not on file  Lifestyle  . Physical activity:    Days per week: Not on file    Minutes per session: Not on file  . Stress: Not on file  Relationships  . Social connections:    Talks on phone: Not on file    Gets together: Not on file    Attends religious service: Not on file    Active member of club or organization: Not on file    Attends meetings of clubs or organizations: Not on file    Relationship status: Not on file  Other Topics Concern  . Not on file  Social History Narrative   ** Merged History Encounter **         Family History: The patient's family history is not on file. ROS:   Please see the history of present illness.    All other systems reviewed and are negative.  EKGs/Labs/Other Studies Reviewed:    The following studies were reviewed today:   Recent Labs: 06/12/2018: ALT 16; B Natriuretic Peptide 541.7 06/15/2018: Hemoglobin 9.6; Platelets 249 06/23/2018: BUN 13; Creatinine, Ser 0.67; Potassium 4.7; Sodium 140    Ref Range & Units 3wk ago (06/15/18) 3wk ago (06/14/18) 3wk ago (06/13/18) 3wk ago (06/12/18) 14yr ago (08/20/09)  WBC 4.0 - 10.5 K/uL 14.5High   17.4High   18.2High   19.5High   11.4High    RBC 3.87 - 5.11 MIL/uL 3.62Low   3.71Low   3.96  4.23  4.37   Hemoglobin 12.0 - 15.0 g/dL 1.6XWR   6.0AVW   09.8JXB   11.2Low   13.3     Recent Lipid Panel No results found for: CHOL, TRIG, HDL, CHOLHDL, VLDL, LDLCALC,  LDLDIRECT  Physical Exam:    VS:  BP (!) 160/70 (BP Location: Left Arm, Patient Position: Sitting, Cuff  Size: Normal)   Pulse 78   Ht 5\' 1"  (1.549 m)   Wt 218 lb 8 oz (99.1 kg)   SpO2 96%   BMI 41.29 kg/m     Wt Readings from Last 3 Encounters:  07/07/18 218 lb 8 oz (99.1 kg)  06/23/18 220 lb 3.2 oz (99.9 kg)  06/15/18 214 lb (97.1 kg)     GEN:  Well nourished, well developed in no acute distress HEENT: Normal NECK: No JVD; No carotid bruits LYMPHATICS: No lymphadenopathy CARDIAC: Irr Irr variable s1 RRR, no murmurs, rubs, gallops RESPIRATORY:  Clear to auscultation without rales, wheezing or rhonchi  ABDOMEN: Soft, non-tender, non-distended MUSCULOSKELETAL:  No edema; No deformity  SKIN: Warm and dry NEUROLOGIC:  Alert and oriented x 3 PSYCHIATRIC:  Normal affect    Signed, Norman Herrlich, MD  07/07/2018 4:36 PM    [facility]

## 2018-07-07 ENCOUNTER — Encounter: Payer: Self-pay | Admitting: Cardiology

## 2018-07-07 ENCOUNTER — Ambulatory Visit (INDEPENDENT_AMBULATORY_CARE_PROVIDER_SITE_OTHER): Payer: Medicare Other | Admitting: Cardiology

## 2018-07-07 VITALS — BP 160/70 | HR 78 | Ht 61.0 in | Wt 218.5 lb

## 2018-07-07 DIAGNOSIS — I340 Nonrheumatic mitral (valve) insufficiency: Secondary | ICD-10-CM

## 2018-07-07 DIAGNOSIS — D649 Anemia, unspecified: Secondary | ICD-10-CM

## 2018-07-07 DIAGNOSIS — I517 Cardiomegaly: Secondary | ICD-10-CM

## 2018-07-07 DIAGNOSIS — Z7901 Long term (current) use of anticoagulants: Secondary | ICD-10-CM

## 2018-07-07 DIAGNOSIS — I110 Hypertensive heart disease with heart failure: Secondary | ICD-10-CM

## 2018-07-07 DIAGNOSIS — I5032 Chronic diastolic (congestive) heart failure: Secondary | ICD-10-CM

## 2018-07-07 DIAGNOSIS — E785 Hyperlipidemia, unspecified: Secondary | ICD-10-CM

## 2018-07-07 DIAGNOSIS — I214 Non-ST elevation (NSTEMI) myocardial infarction: Secondary | ICD-10-CM

## 2018-07-07 DIAGNOSIS — I25110 Atherosclerotic heart disease of native coronary artery with unstable angina pectoris: Secondary | ICD-10-CM | POA: Diagnosis not present

## 2018-07-07 DIAGNOSIS — R2681 Unsteadiness on feet: Secondary | ICD-10-CM

## 2018-07-07 DIAGNOSIS — I350 Nonrheumatic aortic (valve) stenosis: Secondary | ICD-10-CM

## 2018-07-07 MED ORDER — POTASSIUM CHLORIDE ER 20 MEQ PO TBCR: 20.0000 meq | EXTENDED_RELEASE_TABLET | Freq: Two times a day (BID) | ORAL | 2 refills | Status: DC

## 2018-07-07 MED ORDER — TORSEMIDE 20 MG PO TABS: 40.0000 mg | ORAL_TABLET | Freq: Two times a day (BID) | ORAL | 1 refills | Status: DC

## 2018-07-07 MED ORDER — ATORVASTATIN CALCIUM 40 MG PO TABS: ORAL_TABLET | ORAL | 6 refills | Status: DC

## 2018-07-07 NOTE — Patient Instructions (Addendum)
Medication Instructions:  Your physician has recommended you make the following change in your medication:   DECREASE atorvastatin 40 mg to every other day STOP: furosemide START: torsemide 20mg  two tablet twice daily START: potassium 20 meq take one tablet twice daily  If you need a refill on your cardiac medications before your next appointment, please call your pharmacy.   Lab work: You will have lab work today: CBC, BMP, ProBNP If you have labs (blood work) drawn today and your tests are completely normal, you will receive your results only by: Marland Kitchen. MyChart Message (if you have MyChart) OR . A paper copy in the mail If you have any lab test that is abnormal or we need to change your treatment, we will call you to review the results.  Testing/Procedures: NONE  Follow-Up: At [facility], you and your health needs are our priority.  As part of our continuing mission to provide you with exceptional heart care, we have created designated Provider Care Teams.  These Care Teams include your primary Cardiologist (physician) and Advanced Practice Providers (APPs -  Physician Assistants and Nurse Practitioners) who all work together to provide you with the care you need, when you need it. You will need a follow up appointment in 2 weeks.   Torsemide tablets What is this medicine? TORSEMIDE (TORE se mide) is a diuretic. It helps you make more urine and to lose salt and excess water from your body. This medicine is used to treat high blood pressure, and edema or swelling from heart, kidney, or liver disease. This medicine may be used for other purposes; ask your health care provider or pharmacist if you have questions. COMMON BRAND NAME(S): Demadex What should I tell my health care provider before I take this medicine? They need to know if you have any of these conditions: -abnormal blood electrolytes -diabetes -gout -heart disease -kidney disease -liver disease -small amounts of urine,  or difficulty passing urine -an unusual or allergic reaction to torsemide, sulfa drugs, other medicines, foods, dyes, or preservatives -pregnant or trying to get pregnant -breast-feeding How should I use this medicine? Take this medicine by mouth with a glass of water. Follow the directions on the prescription label. You may take this medicine with or without food. If it upsets your stomach, take it with food or milk. Do not take your medicine more often than directed. Remember that you will need to pass more urine after taking this medicine. Do not take your medicine at a time of day that will cause you problems. Do not take at bedtime. Talk to your pediatrician regarding the use of this medicine in children. Special care may be needed. Overdosage: If you think you have taken too much of this medicine contact a poison control center or emergency room at once. NOTE: This medicine is only for you. Do not share this medicine with others. What if I miss a dose? If you miss a dose, take it as soon as you can. If it is almost time for your next dose, take only that dose. Do not take double or extra doses. What may interact with this medicine? -alcohol -certain antibiotics given by injection certain heart medicines like digoxin -diuretics -lithium -medicines for diabetes -medicines for blood pressure -medicines for cholesterol like cholestyramine -medicines that relax muscles for surgery -NSAIDs, medicines for pain and inflammation, like ibuprofen or naproxen -OTC supplements like ginseng and ephedra -probenecid -steroid medicines like prednisone or cortisone This list may not describe all possible  interactions. Give your health care provider a list of all the medicines, herbs, non-prescription drugs, or dietary supplements you use. Also tell them if you smoke, drink alcohol, or use illegal drugs. Some items may interact with your medicine. What should I watch for while using this  medicine? Visit your doctor or health care professional for regular checks on your progress. Check your blood pressure regularly. Ask your doctor or health care professional what your blood pressure should be, and when you should contact him or her. If you are a diabetic, check your blood sugar as directed. You may need to be on a special diet while taking this medicine. Check with your doctor. Also, ask how many glasses of fluid you need to drink a day. You must not get dehydrated. You may get drowsy or dizzy. Do not drive, use machinery, or do anything that needs mental alertness until you know how this drug affects you. Do not stand or sit up quickly, especially if you are an older patient. This reduces the risk of dizzy or fainting spells. Alcohol can make you more drowsy and dizzy. Avoid alcoholic drinks. What side effects may I notice from receiving this medicine? Side effects that you should report to your doctor or health care professional as soon as possible: -allergic reactions such as skin rash or itching, hives, swelling of the lips, mouth, tongue or throat -blood in urine or stool -dry mouth -hearing loss or ringing in the ears -irregular heartbeat -muscle pain, weakness or cramps -pain or difficulty passing urine -unusually weak or tired -vomiting or diarrhea Side effects that usually do not require medical attention (report to your doctor or health care professional if they continue or are bothersome): -dizzy or lightheaded -headache -increased thirst -passing large amounts of urine -sexual difficulties -stomach pain, upset or nausea This list may not describe all possible side effects. Call your doctor for medical advice about side effects. You may report side effects to FDA at 1-800-FDA-1088. Where should I keep my medicine? Keep out of the reach of children. Store at room temperature between 15 and 30 degrees C (59 and 86 degrees F). Throw away any unused medicine after the  expiration date. NOTE: This sheet is a summary. It may not cover all possible information. If you have questions about this medicine, talk to your doctor, pharmacist, or health care provider.  2019 Elsevier/Gold Standard (2008-03-22 11:35:45)

## 2018-07-08 ENCOUNTER — Telehealth: Payer: Self-pay

## 2018-07-08 DIAGNOSIS — Z7901 Long term (current) use of anticoagulants: Secondary | ICD-10-CM

## 2018-07-08 HISTORY — DX: Long term (current) use of anticoagulants: Z79.01

## 2018-07-08 LAB — CBC
HEMATOCRIT: 35.8 % (ref 34.0–46.6)
Hemoglobin: 10.9 g/dL — ABNORMAL LOW (ref 11.1–15.9)
MCH: 26.9 pg (ref 26.6–33.0)
MCHC: 30.4 g/dL — ABNORMAL LOW (ref 31.5–35.7)
MCV: 88 fL (ref 79–97)
Platelets: 300 10*3/uL (ref 150–450)
RBC: 4.05 x10E6/uL (ref 3.77–5.28)
RDW: 14.8 % (ref 12.3–15.4)
WBC: 10 10*3/uL (ref 3.4–10.8)

## 2018-07-08 LAB — PRO B NATRIURETIC PEPTIDE: NT-Pro BNP: 2923 pg/mL — ABNORMAL HIGH (ref 0–738)

## 2018-07-08 LAB — BASIC METABOLIC PANEL
BUN/Creatinine Ratio: 17 (ref 12–28)
BUN: 13 mg/dL (ref 8–27)
CO2: 27 mmol/L (ref 20–29)
Calcium: 9.4 mg/dL (ref 8.7–10.3)
Chloride: 104 mmol/L (ref 96–106)
Creatinine, Ser: 0.75 mg/dL (ref 0.57–1.00)
GFR calc Af Amer: 84 mL/min/{1.73_m2} (ref >59)
GFR calc non Af Amer: 73 mL/min/{1.73_m2} (ref >59)
Glucose: 117 mg/dL — ABNORMAL HIGH (ref 65–99)
Potassium: 4.8 mmol/L (ref 3.5–5.2)
Sodium: 142 mmol/L (ref 134–144)

## 2018-07-08 NOTE — Telephone Encounter (Signed)
Left message on patients home with stable lab results per Dr Dulce SellarMunley.  I spoke with Enrique SackKendra at [facility] regarding heart failure program.  She states that the patient is currently enrolled in the program, and she would notify nurse to start with heart failure education with patient.

## 2018-07-11 ENCOUNTER — Telehealth: Payer: Self-pay | Admitting: Cardiology

## 2018-07-11 NOTE — Telephone Encounter (Signed)
Patient's BMI is too high and they cant do a vest for [facility].. you may call her if needed.Lawson Fiscal. Lori

## 2018-07-11 NOTE — Telephone Encounter (Signed)
FYI

## 2018-07-15 ENCOUNTER — Telehealth: Payer: Self-pay | Admitting: Cardiology

## 2018-07-15 MED ORDER — CLOPIDOGREL BISULFATE 75 MG PO TABS: 75.0000 mg | ORAL_TABLET | Freq: Every day | ORAL | 1 refills | Status: DC

## 2018-07-15 MED ORDER — APIXABAN 5 MG PO TABS: 5.0000 mg | ORAL_TABLET | Freq: Two times a day (BID) | ORAL | 1 refills | Status: DC

## 2018-07-15 MED ORDER — POTASSIUM CHLORIDE ER 20 MEQ PO TBCR: 20.0000 meq | EXTENDED_RELEASE_TABLET | Freq: Two times a day (BID) | ORAL | 1 refills | Status: DC

## 2018-07-15 MED ORDER — ATORVASTATIN CALCIUM 40 MG PO TABS: ORAL_TABLET | ORAL | 1 refills | Status: DC

## 2018-07-15 NOTE — Telephone Encounter (Signed)
Spoke with patient's daughter, Angelique BlonderDenise, per [facility] needing to be [facility]. [facility] have [facility] [place] as requested.

## 2018-07-22 DIAGNOSIS — M8580 Other specified disorders of bone density and structure, unspecified site: Secondary | ICD-10-CM

## 2018-07-22 DIAGNOSIS — N3941 Urge incontinence: Secondary | ICD-10-CM

## 2018-07-22 DIAGNOSIS — E1142 Type 2 diabetes mellitus with diabetic polyneuropathy: Secondary | ICD-10-CM | POA: Insufficient documentation

## 2018-07-22 DIAGNOSIS — K219 Gastro-esophageal reflux disease without esophagitis: Secondary | ICD-10-CM

## 2018-07-22 DIAGNOSIS — E559 Vitamin D deficiency, unspecified: Secondary | ICD-10-CM | POA: Insufficient documentation

## 2018-07-22 DIAGNOSIS — I872 Venous insufficiency (chronic) (peripheral): Secondary | ICD-10-CM | POA: Insufficient documentation

## 2018-07-22 DIAGNOSIS — E7800 Pure hypercholesterolemia, unspecified: Secondary | ICD-10-CM

## 2018-07-22 DIAGNOSIS — R32 Unspecified urinary incontinence: Secondary | ICD-10-CM | POA: Insufficient documentation

## 2018-07-22 DIAGNOSIS — H547 Unspecified visual loss: Secondary | ICD-10-CM | POA: Insufficient documentation

## 2018-07-22 DIAGNOSIS — M1990 Unspecified osteoarthritis, unspecified site: Secondary | ICD-10-CM | POA: Insufficient documentation

## 2018-07-22 DIAGNOSIS — G63 Polyneuropathy in diseases classified elsewhere: Secondary | ICD-10-CM | POA: Insufficient documentation

## 2018-07-22 HISTORY — DX: Pure hypercholesterolemia, unspecified: E78.00

## 2018-07-22 HISTORY — DX: Type 2 diabetes mellitus with diabetic polyneuropathy: E11.42

## 2018-07-22 HISTORY — DX: Urge incontinence: N39.41

## 2018-07-22 HISTORY — DX: Polyneuropathy in diseases classified elsewhere: G63

## 2018-07-22 HISTORY — DX: Venous insufficiency (chronic) (peripheral): I87.2

## 2018-07-22 HISTORY — DX: Other specified disorders of bone density and structure, unspecified site: M85.80

## 2018-07-22 HISTORY — DX: Unspecified urinary incontinence: R32

## 2018-07-22 HISTORY — DX: Unspecified visual loss: H54.7

## 2018-07-22 HISTORY — DX: Gastro-esophageal reflux disease without esophagitis: K21.9

## 2018-07-22 HISTORY — DX: Vitamin D deficiency, unspecified: E55.9

## 2018-07-25 ENCOUNTER — Encounter: Payer: Self-pay | Admitting: Cardiology

## 2018-07-25 ENCOUNTER — Ambulatory Visit (INDEPENDENT_AMBULATORY_CARE_PROVIDER_SITE_OTHER): Payer: Medicare Other | Admitting: Cardiology

## 2018-07-25 VITALS — BP 138/72 | HR 59 | Ht 61.0 in | Wt 210.0 lb

## 2018-07-25 DIAGNOSIS — I5032 Chronic diastolic (congestive) heart failure: Secondary | ICD-10-CM | POA: Diagnosis not present

## 2018-07-25 DIAGNOSIS — E785 Hyperlipidemia, unspecified: Secondary | ICD-10-CM

## 2018-07-25 DIAGNOSIS — I110 Hypertensive heart disease with heart failure: Secondary | ICD-10-CM

## 2018-07-25 DIAGNOSIS — I25110 Atherosclerotic heart disease of native coronary artery with unstable angina pectoris: Secondary | ICD-10-CM

## 2018-07-25 DIAGNOSIS — I4819 Other persistent atrial fibrillation: Secondary | ICD-10-CM | POA: Diagnosis not present

## 2018-07-25 MED ORDER — PRAVASTATIN SODIUM 20 MG PO TABS: 20.0000 mg | ORAL_TABLET | Freq: Every evening | ORAL | 3 refills | Status: DC

## 2018-07-25 MED ORDER — TORSEMIDE 20 MG PO TABS: ORAL_TABLET | ORAL | 1 refills | Status: DC

## 2018-07-25 NOTE — Progress Notes (Signed)
Cardiology Office Note:    Date:  07/25/2018   ID:  Donna Ballard, DOB 06/26/1931, MRN [id number]  PCP:  Everlean CherryWhyte, Donna M, MD  Cardiologist:  Donna HerrlichBrian Munley, MD    Referring MD: Everlean CherryWhyte, Donna M, MD    ASSESSMENT:    1. Chronic diastolic heart failure (HCC)   2. Persistent atrial fibrillation   3. Hypertensive heart disease with chronic diastolic congestive heart failure (HCC)   4. Coronary artery disease involving native coronary artery of native heart with unstable angina pectoris (HCC)   5. Hyperlipidemia, unspecified hyperlipidemia type    PLAN:    In order of problems listed above:  1. She is quite improved diuresed 14 pounds presently New York Heart Association class I and is living independently with supervision of her family.  If her weights dropped to 201 or less will reduce the dose of her diuretic 50%.  Recheck renal function and proBNP today 2. Rate controlled continue her current beta-blocker and clopidogrel 3. Stable blood pressure continue current treatment 4. Stable CAD continue medical therapy having no anginal discomfort 5. Stable she is having trouble tolerating statin with muscle pain withdrawal 1 week and start low-dose 6. Intensity statin   Next appointment: 2 months   Medication Adjustments/Labs and Tests Ordered: Current medicines are reviewed at length with the patient today.  Concerns regarding medicines are outlined above.  Orders Placed This Encounter  Procedures  . Basic Metabolic Panel (BMET)  . Pro b natriuretic peptide (BNP)   Meds ordered this encounter  Medications  . pravastatin (PRAVACHOL) 20 MG tablet    Sig: Take 1 tablet (20 mg total) by mouth every evening.    Dispense:  30 tablet    Refill:  3  . torsemide (DEMADEX) 20 MG tablet    Sig: Take 2 tablets twice daily until your weight is 201 or less then take 1 tablet twice daily.    Dispense:  120 tablet    Refill:  1    No chief complaint on file.   History of Present Illness:     Donna Ballard is a 85 y.o. female with a hx of CAD recent non-ST segment elevation MI with PCI and stent LAD stenosis last seen at the time of hospital discharge 06/12/2018 Dr. Rennis Ballard.  Other problems included acute diastolic heart failure rapid atrial fibrillation hypertension hyperlipidemia and hypo-kalemia.  Other studies performed including an echocardiogram which showed the presence of mild aortic valve stenosis mild mitral regurgitation moderate to severe left atrial enlargement moderate right atrial enlargement.  She remained in atrial fibrillation with rate control with cardioversion.  Hypokalemia was repleted frequent PVCs improved and anemia was stable with hemoglobins ranging between 9.6 and 10.6 at the time of hospital discharge because of gait instability and frailty she is seen by physical therapy and Occupational Therapy.  She was last seen by me Donna M[facility] 03/21/2015 at that time she had stable CAD and hypertension.    She was  last seen 07/07/18 with decompensated heart failure Pro BNP 2973 Hgb 10.9 BMP normal. Compliance with diet, lifestyle and medications: Yes  Doing better has diuresed 14 pounds and marked improvement she is able to bathe and is not short of breath no orthopnea edema.  She is having muscle pain at nighttime will discontinue atorvastatin wait 1 week placed on a low-dose of low intensity statin.  Follow-up lipid profile next office visit Past Medical History:  Diagnosis Date  . Arthritis   . Atrial fibrillation (  HCC)    a. dx 05/2018, rate control strategy for now.  Marland Kitchen. CAD in native artery    a. NSTEMI 05/2018 s/p DES to LAD with residual dz treated medically.  . Chronic bronchitis (HCC)   . Chronic diastolic CHF (congestive heart failure) (HCC)   . Diabetes mellitus without complication (HCC)   . Frailty   . Gait instability   . GERD (gastroesophageal reflux disease)   . HLD (hyperlipidemia)   . HTN (hypertension)   . Hypertension   . Hypokalemia    . Insulin dependent diabetes mellitus (HCC)   . Muscle pain   . Normocytic anemia   . Osteoarthritis   . PVC's (premature ventricular contractions)   . Swelling     Past Surgical History:  Procedure Laterality Date  . ABDOMINAL HYSTERECTOMY    . CARDIAC CATHETERIZATION    . CHOLECYSTECTOMY    . CORONARY ANGIOPLASTY    . CORONARY STENT INTERVENTION N/A 06/13/2018   Procedure: CORONARY STENT INTERVENTION;  Surgeon: Yvonne KendallEnd, Christopher, MD;  Location: [facility];  Service: Cardiovascular;  Laterality: N/A;  . LEFT HEART CATH AND CORONARY ANGIOGRAPHY N/A 06/13/2018   Procedure: LEFT HEART CATH AND CORONARY ANGIOGRAPHY;  Surgeon: Yvonne KendallEnd, Christopher, MD;  Location: [facility];  Service: Cardiovascular;  Laterality: N/A;  . parithyroid surgery     09/2013  . TONSILLECTOMY      Current Medications: Current Meds  Medication Sig  . acetaminophen (TYLENOL) 500 MG tablet Take 500 mg by mouth daily as needed.  Marland Kitchen. albuterol (PROVENTIL HFA;VENTOLIN HFA) 108 (90 Base) MCG/ACT inhaler Inhale 2 puffs into the lungs every 6 (six) hours as needed for wheezing or shortness of breath.  Marland Kitchen. apixaban (ELIQUIS) 5 MG TABS tablet Take 1 tablet (5 mg total) by mouth 2 (two) times daily.  . carvedilol (COREG) 25 MG tablet Take 25 mg by mouth 2 (two) times daily with a meal.  . cholecalciferol (VITAMIN D3) 25 MCG (1000 UT) tablet Take 1,000 Units by mouth daily.  . clopidogrel (PLAVIX) 75 MG tablet Take 1 tablet (75 mg total) by mouth daily.  . folic acid (FOLVITE) 800 MCG tablet Take 800 mcg by mouth daily.   Marland Kitchen. glucose blood test strip 1 each by Other route as needed for other. Use as instructed  . insulin glargine (LANTUS) 100 UNIT/ML injection Inject 20 Units into the skin daily.   . Insulin Syringe-Needle U-100 (INSULIN SYRINGE 1CC/31GX5/16") 31G X 5/16" 1 ML MISC Use one daily for DM2 E11.9 on insulin  . ipratropium-albuterol (DUONEB) 0.5-2.5 (3) MG/3ML SOLN Inhale into the lungs.  . Multiple  Vitamins-Minerals (PRESERVISION AREDS PO) Take 1 capsule by mouth every morning.  . nitroGLYCERIN (NITROSTAT) 0.4 MG SL tablet Place 0.4 mg under the tongue every 5 (five) minutes as needed for chest pain.  . polyethylene glycol (MIRALAX / GLYCOLAX) packet Take 17 g by mouth daily.  . Potassium Chloride ER 20 MEQ TBCR Take 20 mEq by mouth 2 (two) times daily.  Marland Kitchen. Propylene Glycol (SYSTANE BALANCE OP) Apply 1 drop to eye 2 (two) times daily.  . ranitidine (ZANTAC) 150 MG tablet Take 150 mg by mouth 2 (two) times daily.  Marland Kitchen. torsemide (DEMADEX) 20 MG tablet Take 2 tablets twice daily until your weight is 201 or less then take 1 tablet twice daily.  . traMADol (ULTRAM) 50 MG tablet Take 1 tablet by mouth as needed.  . vitamin B-12 (CYANOCOBALAMIN) 500 MCG tablet Take 500 mcg by mouth daily.  .Marland Kitchen  Vitamin D, Ergocalciferol, (DRISDOL) 1.25 MG (50000 UT) CAPS capsule Take 1 capsule by mouth once a week.  . [DISCONTINUED] atorvastatin (LIPITOR) 40 MG tablet Take 1 tablet every other day  . [DISCONTINUED] torsemide (DEMADEX) 20 MG tablet Take 2 tablets (40 mg total) by mouth 2 (two) times daily.     Allergies:   Amlodipine besylate; Metformin; Celecoxib; Enalapril maleate; Estrogens; Fluvastatin; Furosemide; Hydrochlorothiazide; Irbesartan; Lisinopril; Losartan; Pantoprazole; Trandolapril; and Valsartan   Social History   Socioeconomic History  . Marital status: Widowed    Spouse name: Not on file  . Number of children: Not on file  . Years of education: Not on file  . Highest education level: Not on file  Occupational History  . Not on file  Social Needs  . Financial resource strain: Not on file  . Food insecurity:    Worry: Not on file    Inability: Not on file  . Transportation needs:    Medical: Not on file    Non-medical: Not on file  Tobacco Use  . Smoking status: Never Smoker  . Smokeless tobacco: Never Used  Substance and Sexual Activity  . Alcohol use: No  . Drug use: No  . Sexual  activity: Not on file  Lifestyle  . Physical activity:    Days per week: Not on file    Minutes per session: Not on file  . Stress: Not on file  Relationships  . Social connections:    Talks on phone: Not on file    Gets together: Not on file    Attends religious service: Not on file    Active member of club or organization: Not on file    Attends meetings of clubs or organizations: Not on file    Relationship status: Not on file  Other Topics Concern  . Not on file  Social History Narrative   ** Merged History Encounter **         Family History: The patient's family history is not on file. ROS:   Please see the history of present illness.    All other systems reviewed and are negative.  EKGs/Labs/Other Studies Reviewed:    The following studies were reviewed today:    Recent Labs: 06/12/2018: ALT 16; B Natriuretic Peptide 541.7 07/07/2018: BUN 13; Creatinine, Ser 0.75; Hemoglobin 10.9; NT-Pro BNP 2,923; Platelets 300; Potassium 4.8; Sodium 142  Recent Lipid Panel No results found for: CHOL, TRIG, HDL, CHOLHDL, VLDL, LDLCALC, LDLDIRECT  Physical Exam:    VS:  BP 138/72 (BP Location: Right Arm, Patient Position: Sitting, Cuff Size: Normal)   Pulse (!) 59   Ht 5\' 1"  (1.549 m)   Wt 210 lb (95.3 kg)   SpO2 96%   BMI 39.68 kg/m     Wt Readings from Last 3 Encounters:  07/25/18 210 lb (95.3 kg)  07/07/18 218 lb 8 oz (99.1 kg)  06/23/18 220 lb 3.2 oz (99.9 kg)     GEN:  Well nourished, well developed in no acute distress HEENT: Normal NECK: No JVD; No carotid bruits LYMPHATICS: No lymphadenopathy CARDIAC: RRR, no murmurs, rubs, gallops RESPIRATORY:  Clear to auscultation without rales, wheezing or rhonchi  ABDOMEN: Soft, non-tender, non-distended MUSCULOSKELETAL:  1-2+ ankle edema  edema; No deformity  SKIN: Warm and dry NEUROLOGIC:  Alert and oriented x 3 PSYCHIATRIC:  Normal affect    Signed, Donna Herrlich, MD  07/25/2018 5:07 PM    [facility]

## 2018-07-25 NOTE — Patient Instructions (Signed)
Medication Instructions:  Your physician has recommended you make the following change in your medication:  CONTINUE torsemide 20 mg: Take 2 tablets (40 mg) twice daily until weight is less than or equal to 201 pounds then DECREASE torsemide 20 mg: take 1 tablet (20 mg) twice daily  STOP atorvastatin  START pravastatin (pravachol) 20 mg: Take 1 tablet daily-START in 1 week  If you need a refill on your cardiac medications before your next appointment, please call your pharmacy.   Lab work: Your physician recommends that you return for lab work today: BMP, ProBNP.   If you have labs (blood work) drawn today and your tests are completely normal, you will receive your results only by: Marland Kitchen. MyChart Message (if you have MyChart) OR . A paper copy in the mail If you have any lab test that is abnormal or we need to change your treatment, we will call you to review the results.  Testing/Procedures: None  Follow-Up: At [facility], you and your health needs are our priority.  As part of our continuing mission to provide you with exceptional heart care, we have created designated Provider Care Teams.  These Care Teams include your primary Cardiologist (physician) and Advanced Practice Providers (APPs -  Physician Assistants and Nurse Practitioners) who all work together to provide you with the care you need, when you need it. You will need a follow up appointment in 4 weeks.       Pravastatin tablets What is this medicine? PRAVASTATIN (PRA va stat in) is known as a HMG-CoA reductase inhibitor or 'statin'. It lowers the level of cholesterol and triglycerides in the blood. This drug may also reduce the risk of heart attack, stroke, or other health problems in patients with risk factors for heart disease. Diet and lifestyle changes are often used with this drug. This medicine may be used for other purposes; ask your health care provider or pharmacist if you have questions. COMMON BRAND NAME(S):  Pravachol What should I tell my health care provider before I take this medicine? They need to know if you have any of these conditions: -diabetes -if you often drink alcohol -history of stroke -kidney disease -liver disease -muscle aches or weakness -thyroid disease -an unusual or allergic reaction to pravastatin, other medicines, foods, dyes, or preservatives -pregnant or trying to get pregnant -breast-feeding How should I use this medicine? Take pravastatin tablets by mouth. Swallow the tablets with a drink of water. Pravastatin can be taken at anytime of the day, with or without food. Follow the directions on the prescription label. Take your doses at regular intervals. Do not take your medicine more often than directed. Talk to your pediatrician regarding the use of this medicine in children. Special care may be needed. Pravastatin has been used in children as young as 98 years of age. Overdosage: If you think you have taken too much of this medicine contact a poison control center or emergency room at once. NOTE: This medicine is only for you. Do not share this medicine with others. What if I miss a dose? If you miss a dose, take it as soon as you can. If it is almost time for your next dose, take only that dose. Do not take double or extra doses. What may interact with this medicine? This medicine may interact with the following medications: -colchicine -cyclosporine -other medicines for high cholesterol -some antibiotics like azithromycin, clarithromycin, erythromycin, and telithromycin This list may not describe all possible interactions. Give your health  care provider a list of all the medicines, herbs, non-prescription drugs, or dietary supplements you use. Also tell them if you smoke, drink alcohol, or use illegal drugs. Some items may interact with your medicine. What should I watch for while using this medicine? Visit your doctor or health care professional for regular  check-ups. You may need regular tests to make sure your liver is working properly. Your health care professional may tell you to stop taking this medicine if you develop muscle problems. If your muscle problems do not go away after stopping this medicine, contact your health care professional. Do not become pregnant while taking this medicine. Women should inform their health care professional if they wish to become pregnant or think they might be pregnant. There is a potential for serious side effects to an unborn child. Talk to your health care professional or pharmacist for more information. Do not breast-feed an infant while taking this medicine. This medicine may affect blood sugar levels. If you have diabetes, check with your doctor or health care professional before you change your diet or the dose of your diabetic medicine. If you are going to need surgery or other procedure, tell your doctor that you are using this medicine. This drug is only part of a total heart-health program. Your doctor or a dietician can suggest a low-cholesterol and low-fat diet to help. Avoid alcohol and smoking, and keep a proper exercise schedule. This medicine may cause a decrease in Co-Enzyme Q-10. You should make sure that you get enough Co-Enzyme Q-10 while you are taking this medicine. Discuss the foods you eat and the vitamins you take with your health care professional. What side effects may I notice from receiving this medicine? Side effects that you should report to your doctor or health care professional as soon as possible: -allergic reactions like skin rash, itching or hives, swelling of the face, lips, or tongue -dark urine -fever -muscle pain, cramps, or weakness -redness, blistering, peeling or loosening of the skin, including inside the mouth -trouble passing urine or change in the amount of urine -unusually weak or tired -yellowing of the eyes or skin Side effects that usually do not require  medical attention (report to your doctor or health care professional if they continue or are bothersome): -gas -headache -heartburn -indigestion -stomach pain This list may not describe all possible side effects. Call your doctor for medical advice about side effects. You may report side effects to FDA at 1-800-FDA-1088. Where should I keep my medicine? Keep out of the reach of children. Store at room temperature between 15 to 30 degrees C (59 to 86 degrees F). Protect from light. Keep container tightly closed. Throw away any unused medicine after the expiration date. NOTE: This sheet is a summary. It may not cover all possible information. If you have questions about this medicine, talk to your doctor, pharmacist, or health care provider.  2019 [employer]vier/Gold Standard (2017-03-09 12:37:09)

## 2018-07-26 LAB — BASIC METABOLIC PANEL
BUN/Creatinine Ratio: 26 (ref 12–28)
BUN: 20 mg/dL (ref 8–27)
CO2: 26 mmol/L (ref 20–29)
CREATININE: 0.76 mg/dL (ref 0.57–1.00)
Calcium: 9.5 mg/dL (ref 8.7–10.3)
Chloride: 98 mmol/L (ref 96–106)
GFR, EST AFRICAN AMERICAN: 83 mL/min/{1.73_m2} (ref >59)
GFR, EST NON AFRICAN AMERICAN: 72 mL/min/{1.73_m2} (ref >59)
Glucose: 147 mg/dL — ABNORMAL HIGH (ref 65–99)
Potassium: 4 mmol/L (ref 3.5–5.2)
Sodium: 139 mmol/L (ref 134–144)

## 2018-07-26 LAB — PRO B NATRIURETIC PEPTIDE: NT-Pro BNP: 2017 pg/mL — ABNORMAL HIGH (ref 0–738)

## 2018-08-09 DIAGNOSIS — I5032 Chronic diastolic (congestive) heart failure: Secondary | ICD-10-CM | POA: Insufficient documentation

## 2018-08-09 DIAGNOSIS — I110 Hypertensive heart disease with heart failure: Secondary | ICD-10-CM

## 2018-08-09 HISTORY — DX: Hypertensive heart disease with heart failure: I50.32

## 2018-08-09 HISTORY — DX: Hypertensive heart disease with heart failure: I11.0

## 2018-08-23 ENCOUNTER — Encounter: Payer: Self-pay | Admitting: Cardiology

## 2018-08-23 ENCOUNTER — Ambulatory Visit (INDEPENDENT_AMBULATORY_CARE_PROVIDER_SITE_OTHER): Payer: Medicare Other | Admitting: Cardiology

## 2018-08-23 VITALS — BP 128/60 | HR 60 | Ht 61.0 in | Wt 204.2 lb

## 2018-08-23 DIAGNOSIS — I110 Hypertensive heart disease with heart failure: Secondary | ICD-10-CM

## 2018-08-23 DIAGNOSIS — I5032 Chronic diastolic (congestive) heart failure: Secondary | ICD-10-CM

## 2018-08-23 DIAGNOSIS — I25110 Atherosclerotic heart disease of native coronary artery with unstable angina pectoris: Secondary | ICD-10-CM

## 2018-08-23 DIAGNOSIS — I4819 Other persistent atrial fibrillation: Secondary | ICD-10-CM | POA: Diagnosis not present

## 2018-08-23 DIAGNOSIS — Z7901 Long term (current) use of anticoagulants: Secondary | ICD-10-CM

## 2018-08-23 NOTE — Patient Instructions (Signed)

## 2018-08-23 NOTE — Progress Notes (Signed)
Cardiology Office Note:    Date:  08/23/2018   ID:  Donna Ballard, DOB 02/13/1931, MRN [id number]  PCP:  Everlean CherryWhyte, Thomas M, MD  Cardiologist:  Norman HerrlichBrian Alajia Schmelzer, MD    Referring MD: Everlean CherryWhyte, Thomas M, MD    ASSESSMENT:    1. Chronic diastolic heart failure (HCC)   2. Hypertensive heart disease with chronic diastolic congestive heart failure (HCC)   3. Coronary artery disease involving native coronary artery of native heart with unstable angina pectoris (HCC)   4. Persistent atrial fibrillation   5. Chronic anticoagulation    PLAN:    In order of problems listed above  1. Nicely compensated continue her current diuretic and close supervision with the family she has telemetry monitoring at home for blood pressure weight oxygen saturation reported to her PCP. 2. Stable blood pressure target continue current treatment including her beta-blocker 3. Stable CAD continue medical therapy with anticoagulant clopidogrel beta-blocker and statin 4. Rate controlled continue beta-blocker anticoagulant 5. Stable continue her anticoagulant   Next appointment: 3 months   Medication Adjustments/Labs and Tests Ordered: Current medicines are reviewed at length with the patient today.  Concerns regarding medicines are outlined above.  No orders of the defined types were placed in this encounter.  No orders of the defined types were placed in this encounter.   No chief complaint on file.   History of Present Illness:    Donna Ballard is a 85 y.o. female with a hx of CAD recent non-ST segment elevation MI with PCI and stent LAD stenosis last seen at the time of hospital discharge 06/12/2018 Dr. Rennis GoldenHilty.  Other problems included acute diastolic heart failure rapid atrial fibrillation hypertension hyperlipidemia and hypo-kalemia.  Other studies performed including an echocardiogram which showed the presence of mild aortic valve stenosis mild mitral regurgitation moderate to severe left atrial enlargement moderate  right atrial enlargement.  She remained in atrial fibrillation with rate control with cardioversion.  Hypokalemia was repleted frequent PVCs improved and anemia was stable with hemoglobins ranging between 9.6 and 10.6 at the time of hospital discharge because of gait instability and frailty she is seen by physical therapy and Occupational Therapy  She was last seen 07/25/18. Compliance with diet, lifestyle and medications: Yes  She is nicely supervised by her daughter and son weights are stable at home down a few pounds sodium restrict and compliant with her diuretic although she bitterly complains of urinary frequency at night and will bring her second dose of diuretic up to 12 noon to mitigate this.  No edema shortness of breath chest pain palpitation or syncope no complaints of bleeding with her anticoagulant.  She was seen by her PCP today has an area tract infection and placed on oral antibiotic.  Labs are followed in Dr[facility] Lucilla [facility]White[facility] Past Medical History:  Diagnosis Date  . Arthritis   . Atrial fibrillation (HCC)    a. dx 05/2018, rate control strategy for now.  Marland Kitchen. CAD in native artery    a. NSTEMI 05/2018 s/p DES to LAD with residual dz treated medically.  . Chronic bronchitis (HCC)   . Chronic diastolic CHF (congestive heart failure) (HCC)   . Diabetes mellitus without complication (HCC)   . Frailty   . Gait instability   . GERD (gastroesophageal reflux disease)   . HLD (hyperlipidemia)   . HTN (hypertension)   . Hypertension   . Hypokalemia   . Insulin dependent diabetes mellitus (HCC)   .  Muscle pain   . Normocytic anemia   . Osteoarthritis   . PVC's (premature ventricular contractions)   . Swelling     Past Surgical History:  Procedure Laterality Date  . ABDOMINAL HYSTERECTOMY    . CARDIAC CATHETERIZATION    . CHOLECYSTECTOMY    . CORONARY ANGIOPLASTY    . CORONARY STENT INTERVENTION N/A 06/13/2018   Procedure: CORONARY STENT  INTERVENTION;  Surgeon: Yvonne KendallEnd, Christopher, MD;  Location: [facility];  Service: Cardiovascular;  Laterality: N/A;  . LEFT HEART CATH AND CORONARY ANGIOGRAPHY N/A 06/13/2018   Procedure: LEFT HEART CATH AND CORONARY ANGIOGRAPHY;  Surgeon: Yvonne KendallEnd, Christopher, MD;  Location: [facility]AS[facility] CV [facility];  Service: Cardiovascular;  Laterality: N/A;  . parithyroid surgery     09/2013  . TONSILLECTOMY      Current Medications: Current Meds  Medication Sig  . acetaminophen (TYLENOL) 500 MG tablet Take 500 mg by mouth daily as needed.  Marland Kitchen. albuterol (PROVENTIL HFA;VENTOLIN HFA) 108 (90 Base) MCG/ACT inhaler Inhale 2 puffs into the lungs every 6 (six) hours as needed for wheezing or shortness of breath.  Marland Kitchen. apixaban (ELIQUIS) 5 MG TABS tablet Take 1 tablet (5 mg total) by mouth 2 (two) times daily.  . carvedilol (COREG) 25 MG tablet Take 25 mg by mouth 2 (two) times daily with a meal.  . cephALEXin (KEFLEX) 250 MG capsule Take by mouth 3 (three) times daily.  . cholecalciferol (VITAMIN D3) 25 MCG (1000 UT) tablet Take 1,000 Units by mouth daily.  . clopidogrel (PLAVIX) 75 MG tablet Take 1 tablet (75 mg total) by mouth daily.  . folic acid (FOLVITE) 800 MCG tablet Take 800 mcg by mouth daily.   Marland Kitchen. glucose blood test strip 1 each by Other route as needed for other. Use as instructed  . insulin glargine (LANTUS) 100 UNIT/ML injection Inject 10 Units into the skin daily.   . Insulin Syringe-Needle U-100 (INSULIN SYRINGE 1CC/31GX5/16") 31G X 5/16" 1 ML MISC Use one daily for DM2 E11.9 on insulin  . ipratropium-albuterol (DUONEB) 0.5-2.5 (3) MG/3ML SOLN Inhale into the lungs.  . Multiple Vitamins-Minerals (PRESERVISION AREDS PO) Take 1 capsule by mouth every morning.  . nitroGLYCERIN (NITROSTAT) 0.4 MG SL tablet Place 0.4 mg under the tongue every 5 (five) minutes as needed for chest pain.  Marland Kitchen. nystatin (NYSTATIN) powder Apply topically as needed.  . ondansetron (ZOFRAN) 4 MG tablet Take 4 mg by mouth every 8 (eight)  hours as needed for nausea or vomiting.  . polyethylene glycol (MIRALAX / GLYCOLAX) packet Take 17 g by mouth daily.  . Potassium Chloride ER 20 MEQ TBCR Take 20 mEq by mouth 2 (two) times daily.  . pravastatin (PRAVACHOL) 20 MG tablet Take 1 tablet (20 mg total) by mouth every evening.  Marland Kitchen. Propylene Glycol (SYSTANE BALANCE OP) Apply 1 drop to eye 2 (two) times daily.  . ranitidine (ZANTAC) 150 MG tablet Take 150 mg by mouth 2 (two) times daily.  Marland Kitchen. torsemide (DEMADEX) 20 MG tablet Take 2 tablets twice daily until your weight is 201 or less then take 1 tablet twice daily.  . Vitamin D, Ergocalciferol, (DRISDOL) 1.25 MG (50000 UT) CAPS capsule Take 1 capsule by mouth once a week.     Allergies:   Amlodipine besylate; Metformin; Celecoxib; Enalapril maleate; Estrogens; Fluvastatin; Furosemide; Hydrochlorothiazide; Irbesartan; Lisinopril; Losartan; Pantoprazole; Trandolapril; and Valsartan   Social History   Socioeconomic History  . Marital status: Widowed    Spouse name: Not on file  .  Number of children: Not on file  . Years of education: Not on file  . Highest education level: Not on file  Occupational History  . Not on file  Social Needs  . Financial resource strain: Not on file  . Food insecurity:    Worry: Not on file    Inability: Not on file  . Transportation needs:    Medical: Not on file    Non-medical: Not on file  Tobacco Use  . Smoking status: Never Smoker  . Smokeless tobacco: Never Used  Substance and Sexual Activity  . Alcohol use: No  . Drug use: No  . Sexual activity: Not on file  Lifestyle  . Physical activity:    Days per week: Not on file    Minutes per session: Not on file  . Stress: Not on file  Relationships  . Social connections:    Talks on phone: Not on file    Gets together: Not on file    Attends religious service: Not on file    Active member of club or organization: Not on file    Attends meetings of clubs or organizations: Not on file     Relationship status: Not on file  Other Topics Concern  . Not on file  Social History Narrative   ** Merged History Encounter **         Family History: The patient's family history includes Diabetes in her sister. ROS:   Please see the history of present illness.    All other systems reviewed and are negative.  EKGs/Labs/Other Studies Reviewed:    The following studies were reviewed today:    Recent Labs: 06/12/2018: ALT 16; B Natriuretic Peptide 541.7 07/07/2018: Hemoglobin 10.9; Platelets 300 07/25/2018: BUN 20; Creatinine, Ser 0.76; NT-Pro BNP 2,017; Potassium 4.0; Sodium 139  Recent Lipid Panel No results found for: CHOL, TRIG, HDL, CHOLHDL, VLDL, LDLCALC, LDLDIRECT  Physical Exam:    VS:  BP 128/60 (BP Location: Left Arm, Patient Position: Sitting, Cuff Size: Normal)   Pulse 60   Ht 5\' 1"  (1.549 m)   Wt 204 lb 4 oz (92.6 kg)   SpO2 96%   BMI 38.59 kg/m     Wt Readings from Last 3 Encounters:  08/23/18 204 lb 4 oz (92.6 kg)  07/25/18 210 lb (95.3 kg)  07/07/18 218 lb 8 oz (99.1 kg)     GEN:  Well nourished, well developed in no acute distress HEENT: Normal NECK: No JVD; No carotid bruits LYMPHATICS: No lymphadenopathy CARDIAC: Irregular rhythm variable first heart sound no murmurs, rubs, gallops RESPIRATORY:  Clear to auscultation without rales, wheezing or rhonchi  ABDOMEN: Soft, non-tender, non-distended MUSCULOSKELETAL:  No edema; No deformity  SKIN: Warm and dry NEUROLOGIC:  Alert and oriented x 3 PSYCHIATRIC:  Normal affect    Signed, Norman Herrlich, MD  08/23/2018 4:27 PM     [facility]

## 2018-09-17 ENCOUNTER — Other Ambulatory Visit: Payer: Self-pay | Admitting: Cardiology

## 2018-09-22 ENCOUNTER — Telehealth: Payer: Self-pay | Admitting: Cardiology

## 2018-09-22 NOTE — Telephone Encounter (Signed)
Donna Ballard patients daughter called and she is having issuew with her kegs hurting from the Statins, Please call patients daughter.Marland Kitchen

## 2018-09-22 NOTE — Telephone Encounter (Signed)
Denice informed that patient should stop taking pravastatin at this time and this can be further discussed during follow up appointment scheduled on Wednesday, 11/23/2018 at 2:40 pm in the [place] office. Denice verbalized understanding. No further questions.

## 2018-09-22 NOTE — Telephone Encounter (Signed)
Denice explained that since patient was switched from atorvastatin to pravastatin 20 mg daily in January 2020, her leg pain has worsened. She is having trouble walking the pain is so severe. Will have Dr. Dulce Sellar advise.

## 2018-09-22 NOTE — Telephone Encounter (Signed)
Left message for patient's daughter, Francesca Oman, to return call per [facility].

## 2018-09-22 NOTE — Telephone Encounter (Signed)
Stop the statin.

## 2018-11-05 ENCOUNTER — Other Ambulatory Visit: Payer: Self-pay | Admitting: Cardiology

## 2018-11-07 ENCOUNTER — Telehealth: Payer: Self-pay | Admitting: Cardiology

## 2018-11-07 NOTE — Telephone Encounter (Signed)
Cardiac Questionnaire:    Since your last visit or hospitalization:    1. Have you been having new or worsening chest pain? no   2. Have you been having new or worsening shortness of breath?no 3. Have you been having new or worsening leg swelling, wt gain, or increase in abdominal girth (pants fitting more tightly)? no   4. Have you had any passing out spells? weakness    *A YES to any of these questions would result in the appointment being kept. *If all the answers to these questions are NO, we should indicate that given the current situation regarding the worldwide coronarvirus pandemic, at the recommendation of the CDC, we are looking to limit gatherings in our waiting area, and thus will reschedule their appointment beyond four weeks from today.   _____________   COVID-19 Pre-Screening Questions:   Do you currently have a fever? no  Have you recently travelled on a cruise, internationally, or to [place], [place], [place], [place], [place], or [place], [place]) ? no  Have you been in contact with someone that is currently pending confirmation of Covid19 testing or has been confirmed to have the Covid19 virus?  no  Are you currently experiencing fatigue or cough? Fatigue feeling weak and almost unable to hold self up     Virtual Visit Pre-Appointment Phone Call  Steps For Call:  1. Confirm consent - "In the setting of the current Covid19 crisis, you are scheduled for a (phone or video) visit with your provider on (date) at (time).  Just as we do with many in-office visits, in order for you to participate in this visit, we must obtain consent.  If you'd like, I can send this to your mychart (if signed up) or email for you to review.  Otherwise, I can obtain your verbal consent now.  All virtual visits are billed to your insurance company just like a normal visit would be.  By agreeing to a virtual visit, we'd like you to understand that the technology does not allow for your provider to  perform an examination, and thus may limit your provider's ability to fully assess your condition. If your provider identifies any concerns that need to be evaluated in person, we will make arrangements to do so.  Finally, though the technology is pretty good, we cannot assure that it will always work on either your or our end, and in the setting of a video visit, we may have to convert it to a phone-only visit.  In either situation, we cannot ensure that we have a secure connection.  Are you willing to proceed?" STAFF: Did the patient verbally acknowledge consent to telehealth visit? Document YES/NO here: Yes  2. Confirm the BEST phone number to call the day of the visit by including in appointment notes  3. Give patient instructions for WebEx/MyChart download to smartphone as below or Doximity/Doxy.me if video visit (depending on what platform provider is using)  4. Advise patient to be prepared with their blood pressure, heart rate, weight, any heart rhythm information, their current medicines, and a piece of paper and pen handy for any instructions they may receive the day of their visit  5. Inform patient they will receive a phone call 15 minutes prior to their appointment time (may be from unknown caller ID) so they should be prepared to answer  6. Confirm that appointment type is correct in Epic appointment notes (VIDEO vs PHONE)     TELEPHONE CALL NOTE  Tristan  Colson has been deemed a candidate for a follow-up tele-health visit to limit community exposure during the Covid-19 pandemic. I spoke with the patient via phone to ensure availability of phone/video source, confirm preferred email & phone number, and discuss instructions and expectations.  I reminded Lissie Tomkins to be prepared with any vital sign and/or heart rhythm information that could potentially be obtained via home monitoring, at the time of her visit. I reminded Kinzlie Kimrey to expect a phone call at the time of her visit if her  visit.  Alvy Beal 11/07/2018 11:24 AM   INSTRUCTIONS FOR DOWNLOADING THE WEBEX APP TO SMARTPHONE  - If Apple, ask patient to go to [employer] and type in WebEx in the search bar. Download [employer], the blue/green circle. If Android, go to [employer] and type in [employer] in the search bar. The app is free but as with any other app downloads, their phone may require them to verify saved payment information or Apple/Android password.  - The patient does NOT have to create an account. - On the day of the visit, the assist will walk the patient through joining the meeting with the meeting number/password.  INSTRUCTIONS FOR DOWNLOADING THE MYCHART APP TO SMARTPHONE  - The patient must first make sure to have activated MyChart and know their login information - If Apple, go to [employer] and type in MyChart in the search bar and download the app. If Android, ask patient to go to [employer] and type in [place] in the search bar and download the app. The app is free but as with any other app downloads, their phone may require them to verify saved payment information or Apple/Android password.  - The patient will need to then log into the app with their MyChart username and password, and select [facility] as their healthcare provider to link the account. When it is time for your visit, go to the MyChart app, find appointments, and click Begin Video Visit. Be sure to Select Allow for your device to access the Microphone and Camera for your visit. You will then be connected, and your provider will be with you shortly.  **If they have any issues connecting, or need assistance please contact MyChart service desk [phone])**  **If using a computer, in order to ensure the best quality for their visit they will need to use either of the following Internet Browsers: [employer], or Google Chrome**  IF USING DOXIMITY or DOXY.ME - The patient will receive a link just  prior to their visit, either by text or email (to be determined day of appointment depending on if it's doxy.me or Doximity).     FULL LENGTH CONSENT FOR TELE-HEALTH VISIT   I hereby voluntarily request, consent and authorize [facility] and its employed or contracted physicians, physician assistants, nurse practitioners or other licensed health care professionals (the Practitioner), to provide me with telemedicine health care services (the Services") as deemed necessary by the treating Practitioner. I acknowledge and consent to receive the Services by the Practitioner via telemedicine. I understand that the telemedicine visit will involve communicating with the Practitioner through live audiovisual communication technology and the disclosure of certain medical information by electronic transmission. I acknowledge that I have been given the opportunity to request an in-person assessment or other available alternative prior to the telemedicine visit and am voluntarily participating in the telemedicine visit.  I understand that I have the right to withhold or withdraw  my consent to the use of telemedicine in the course of my care at any time, without affecting my right to future care or treatment, and that the Practitioner or I may terminate the telemedicine visit at any time. I understand that I have the right to inspect all information obtained and/or recorded in the course of the telemedicine visit and may receive copies of available information for a reasonable fee.  I understand that some of the potential risks of receiving the Services via telemedicine include:   Delay or interruption in medical evaluation due to technological equipment failure or disruption;  Information transmitted may not be sufficient (e.g. poor resolution of images) to allow for appropriate medical decision making by the Practitioner; and/or   In rare instances, security protocols could fail, causing a breach of personal  health information.  Furthermore, I acknowledge that it is my responsibility to provide information about my medical history, conditions and care that is complete and accurate to the best of my ability. I acknowledge that Practitioner's advice, recommendations, and/or decision may be based on factors not within their control, such as incomplete or inaccurate data provided by me or distortions of diagnostic images or specimens that may result from electronic transmissions. I understand that the practice of medicine is not an exact science and that Practitioner makes no warranties or guarantees regarding treatment outcomes. I acknowledge that I will receive a copy of this consent concurrently upon execution via email to the email address I last provided but may also request a printed copy by calling the office of [facility].    I understand that my insurance will be billed for this visit.   I have read or had this consent read to me.  I understand the contents of this consent, which adequately explains the benefits and risks of the Services being provided via telemedicine.   I have been provided ample opportunity to ask questions regarding this consent and the Services and have had my questions answered to my satisfaction.  I give my informed consent for the services to be provided through the use of telemedicine in my medical care  By participating in this telemedicine visit I agree to the above.

## 2018-11-07 NOTE — Telephone Encounter (Signed)
Clopidogrel sent to [facility]

## 2018-11-08 ENCOUNTER — Telehealth (INDEPENDENT_AMBULATORY_CARE_PROVIDER_SITE_OTHER): Payer: Medicare Other | Admitting: Cardiology

## 2018-11-08 ENCOUNTER — Encounter: Payer: Self-pay | Admitting: Cardiology

## 2018-11-08 ENCOUNTER — Other Ambulatory Visit: Payer: Self-pay

## 2018-11-08 VITALS — BP 124/71 | HR 76 | Ht 61.0 in | Wt 203.0 lb

## 2018-11-08 DIAGNOSIS — I5032 Chronic diastolic (congestive) heart failure: Secondary | ICD-10-CM | POA: Diagnosis not present

## 2018-11-08 DIAGNOSIS — I4819 Other persistent atrial fibrillation: Secondary | ICD-10-CM

## 2018-11-08 DIAGNOSIS — E785 Hyperlipidemia, unspecified: Secondary | ICD-10-CM

## 2018-11-08 DIAGNOSIS — I110 Hypertensive heart disease with heart failure: Secondary | ICD-10-CM

## 2018-11-08 DIAGNOSIS — Z7901 Long term (current) use of anticoagulants: Secondary | ICD-10-CM

## 2018-11-08 DIAGNOSIS — I25110 Atherosclerotic heart disease of native coronary artery with unstable angina pectoris: Secondary | ICD-10-CM

## 2018-11-08 MED ORDER — FAMOTIDINE 20 MG PO TABS: 20.0000 mg | ORAL_TABLET | Freq: Every day | ORAL | 1 refills | Status: DC

## 2018-11-08 MED ORDER — TORSEMIDE 20 MG PO TABS: ORAL_TABLET | ORAL | 3 refills | Status: DC

## 2018-11-08 NOTE — Progress Notes (Signed)
Virtual Visit via Video Note   This visit type was conducted due to national recommendations for restrictions regarding the COVID-19 Pandemic (e.g. social distancing) in an effort to limit this patient's exposure and mitigate transmission in our community.  Due to her co-morbid illnesses, this patient is at least at moderate risk for complications without adequate follow up.  This format is felt to be most appropriate for this patient at this time.  All issues noted in this document were discussed and addressed.  A limited physical exam was performed with this format.  Please refer to the patient's chart for her consent to telehealth for [facility].   Evaluation Performed:  Follow-up visit  Date:  11/08/2018   ID:  Donna Ballard, Donna Ballard Dec 06, 1930, MRN [id number]  Patient Location: Home Provider Location: Home  PCP:  Everlean Cherry, MD  Cardiologist:  Norman Herrlich, MD  Electrophysiologist:  None   Chief Complaint: Heart failure follow-up  History of Present Illness:    Donna Ballard is a 85 y.o. female with a hx of CAD recent non-ST segment elevation MI with PCI and stent LAD stenosis last seen 08/23/18.  Other problems included acute diastolic heart failure rapid atrial fibrillation hypertension hyperlipidemia and hypo-kalemia.  Other studies performed including an echocardiogram which showed the presence of mild aortic valve stenosis mild mitral regurgitation moderate to severe left atrial enlargement moderate right atrial enlargement.    At the time of her last visit her heart failure was compensators with close supervision by her family blood pressure was at target coronary artery disease with stable with combined antiplatelet clopidogrel therapy and atrial fibrillation was rate controlled.  The patient does not have symptoms concerning for COVID-19 infection (fever, chills, cough, or new shortness of breath).   Overall she is doing well she is very nicely supervised by her children her  weights are stable at 203 pounds she has no edema shortness of breath orthopnea chest pain palpitation or syncope.  She has had no bleeding from her anticoagulant.  She restrict sodium in the family supervises medications.  Her predominant complaints are chronic visual loss and her daughter says she has macular degeneration and her other complaint is severe joint pain knees and hips and a marked reduction in her ability to ambulate because of it.  She is anticoagulated she takes Tylenol I told the family not to use nonsteroidal anti-inflammatory drugs and at the next office visit they will discuss with Dr. Cliffton Asters other modalities for treatment of arthritis.  Past Medical History:  Diagnosis Date  . Arthritis   . Atrial fibrillation (HCC)    a. dx 05/2018, rate control strategy for now.  Marland Kitchen CAD in native artery    a. NSTEMI 05/2018 s/p DES to LAD with residual dz treated medically.  . Chronic bronchitis (HCC)   . Chronic diastolic CHF (congestive heart failure) (HCC)   . Diabetes mellitus without complication (HCC)   . Frailty   . Gait instability   . GERD (gastroesophageal reflux disease)   . HLD (hyperlipidemia)   . HTN (hypertension)   . Hypertension   . Hypokalemia   . Insulin dependent diabetes mellitus (HCC)   . Muscle pain   . Normocytic anemia   . Osteoarthritis   . PVC's (premature ventricular contractions)   . Swelling    Past Surgical History:  Procedure Laterality Date  . ABDOMINAL HYSTERECTOMY    . CARDIAC CATHETERIZATION    . CHOLECYSTECTOMY    . CORONARY ANGIOPLASTY    .  CORONARY STENT INTERVENTION N/A 06/13/2018   Procedure: CORONARY STENT INTERVENTION;  Surgeon: Yvonne Kendall, MD;  Location: [facility] INVASIVE CV LAB;  Service: Cardiovascular;  Laterality: N/A;  . LEFT HEART CATH AND CORONARY ANGIOGRAPHY N/A 06/13/2018   Procedure: LEFT HEART CATH AND CORONARY ANGIOGRAPHY;  Surgeon: Yvonne Kendall, MD;  Location: [facility] INVASIVE CV LAB;  Service: Cardiovascular;   Laterality: N/A;  . parithyroid surgery     09/2013  . TONSILLECTOMY       Current Meds  Medication Sig  . acetaminophen (TYLENOL) 500 MG tablet Take 500 mg by mouth daily as needed.  Marland Kitchen albuterol (PROVENTIL HFA;VENTOLIN HFA) 108 (90 Base) MCG/ACT inhaler Inhale 2 puffs into the lungs every 6 (six) hours as needed for wheezing or shortness of breath.  . carvedilol (COREG) 25 MG tablet Take 25 mg by mouth 2 (two) times daily with a meal.  . cholecalciferol (VITAMIN D3) 25 MCG (1000 UT) tablet Take 1,000 Units by mouth daily.  . clopidogrel (PLAVIX) 75 MG tablet TAKE 1 TABLET BY MOUTH ONCE DAILY.  Marland Kitchen ELIQUIS 5 MG TABS tablet TAKE 1 TABLET BY MOUTH TWICE DAILY.  . folic acid (FOLVITE) 800 MCG tablet Take 800 mcg by mouth daily.   Marland Kitchen glucose blood test strip 1 each by Other route as needed for other. Use as instructed  . insulin glargine (LANTUS) 100 UNIT/ML injection Inject 10 Units into the skin daily.   . Insulin Syringe-Needle U-100 (INSULIN SYRINGE 1CC/31GX5/16") 31G X 5/16" 1 ML MISC Use one daily for DM2 E11.9 on insulin  . ipratropium-albuterol (DUONEB) 0.5-2.5 (3) MG/3ML SOLN Inhale 3 mLs into the lungs every 6 (six) hours as needed.   . Multiple Vitamins-Minerals (PRESERVISION AREDS PO) Take 1 capsule by mouth every morning.  . nitroGLYCERIN (NITROSTAT) 0.4 MG SL tablet Place 0.4 mg under the tongue every 5 (five) minutes as needed for chest pain.  Marland Kitchen nystatin (NYSTATIN) powder Apply topically as needed.  . polyethylene glycol (MIRALAX / GLYCOLAX) packet Take 17 g by mouth daily as needed.   . Potassium Chloride ER 20 MEQ TBCR Take 20 mEq by mouth 2 (two) times daily.  Marland Kitchen Propylene Glycol (SYSTANE BALANCE OP) Apply 1 drop to eye 2 (two) times daily.  Marland Kitchen torsemide (DEMADEX) 20 MG tablet Take 2 tablets twice daily until your weight is 201 or less then take 1 tablet twice daily.     Allergies:   Amlodipine besylate; Metformin; Celecoxib; Enalapril maleate; Estrogens; Fluvastatin; Furosemide;  Hydrochlorothiazide; Irbesartan; Lisinopril; Losartan; Pantoprazole; Trandolapril; and Valsartan   Social History   Tobacco Use  . Smoking status: Never Smoker  . Smokeless tobacco: Never Used  Substance Use Topics  . Alcohol use: No  . Drug use: No     Family Hx: The patient's family history includes Diabetes in her sister.  ROS:   Please see the history of present illness.     All other systems reviewed and are negative.   Prior CV studies:   The following studies were reviewed today:    Labs/Other Tests and Data Reviewed:    EKG:  No ECG reviewed.  Recent Labs:   NT-Pro BNP0 - 738 pg/mL2,017High  2,923High   06/12/2018: ALT 16; B Natriuretic Peptide 541.7 07/07/2018: Hemoglobin 10.9; Platelets 300 07/25/2018: BUN 20; Creatinine, Ser 0.76; NT-Pro BNP 2,017; Potassium 4.0; Sodium 139   Recent Lipid Panel No results found for: CHOL, TRIG, HDL, CHOLHDL, LDLCALC, LDLDIRECT  Wt Readings from Last 3 Encounters:  08/23/18 204 lb 4 oz (  92.6 kg)  07/25/18 210 lb (95.3 kg)  07/07/18 218 lb 8 oz (99.1 kg)     Objective:    Vital Signs:  There were no vitals taken for this visit.   VITAL SIGNS:  reviewed GEN:  no acute distress EYES:  sclerae anicteric, EOMI - Extraocular Movements Intact RESPIRATORY:  normal respiratory effort, symmetric expansion CARDIOVASCULAR:  no peripheral edema SKIN:  no rash, lesions or ulcers. MUSCULOSKELETAL:  no obvious deformities. She has no edema or neck vein distention ASSESSMENT & PLAN:    1. Her heart failure is nicely compensated and she has an exceptional family who supervises her.  She will continue her current diuretic but I told her daughter that if her weight went up 3 pounds to a 6 or greater on any day to take 1 extra tablet of torsemide in the continue daily weights and sodium restriction. 2. Hypertensive heart disease stable continue current antihypertensive medications BP is at target with carvedilol and diuretic 3. CAD  stable New York Heart Association class I continue current treatment including combined anticoagulant platelet beta-blocker and the patient will not take lipid-lowering therapy. 4. Atrial fibrillation stable asymptomatic rate controlled beta-blocker continue Eliquis anticoagulant 5. Chronic anticoagulation stable continue Eliquis 6. Hyperlipidemia poorly controlled not can recheck lipids because she will not accept lipid-lowering therapy 7. Joint pain arthritis continue Tylenol and asked her to discuss with Dr. Cliffton AstersWhite other treatment options  COVID-19 Education: The signs and symptoms of COVID-19 were discussed with the patient and how to seek care for testing (follow up with PCP or arrange E-visit).  The importance of social distancing was discussed today.  Time:   Today, I have spent 35 minutes with the patient with telehealth technology discussing the above problems.     Medication Adjustments/Labs and Tests Ordered: Current medicines are reviewed at length with the patient today.  Concerns regarding medicines are outlined above.   Tests Ordered: No orders of the defined types were placed in this encounter.   Medication Changes: No orders of the defined types were placed in this encounter.   Disposition:  Follow up in 3 month(s)  Signed, Norman HerrlichBrian Gearald Stonebraker, MD  11/08/2018 10:06 AM    [facility]

## 2018-11-08 NOTE — Patient Instructions (Addendum)
Medication Instructions:  Your physician has recommended you make the following change in your medication:   CHANGE torsemide (demadex) 20 mg: Take 2 tablets twice daily. Take 1 extra tablet daily if your weight is 206 pounds or greater.  START famotidine (pepcid) 20 mg: Take 1 tablet daily   If you need a refill on your cardiac medications before your next appointment, please call your pharmacy.   Lab work: Your physician recommends that you return for lab work in 1 month: CBC, BMP, ProBNP. Please go to our [place] office for lab work, no appointment needed. No need to fast beforehand.   If you have labs (blood work) drawn today and your tests are completely normal, you will receive your results only by: Marland Kitchen. MyChart Message (if you have MyChart) OR . A paper copy in the mail If you have any lab test that is abnormal or we need to change your treatment, we will call you to review the results.  Testing/Procedures: None  Follow-Up: At [facility], you and your health needs are our priority.  As part of our continuing mission to provide you with exceptional heart care, we have created designated Provider Care Teams.  These Care Teams include your primary Cardiologist (physician) and Advanced Practice Providers (APPs -  Physician Assistants and Nurse Practitioners) who all work together to provide you with the care you need, when you need it. You will need a follow up virtual appointment in 3 months: Wednesday, 02/01/2019, at 2:00 pm.     Famotidine tablets or gelcaps What is this medicine? FAMOTIDINE (fa MOE ti deen) is a type of antihistamine that blocks the release of stomach acid. It is used to treat stomach or intestinal ulcers. It can also relieve heartburn from acid reflux. This medicine may be used for other purposes; ask your health care provider or pharmacist if you have questions. COMMON BRAND NAME(S): Heartburn Relief, Pepcid, Pepcid AC, Pepcid AC Maximum Strength What should  I tell my health care provider before I take this medicine? They need to know if you have any of these conditions: -kidney or liver disease -trouble swallowing -an unusual or allergic reaction to famotidine, other medicines, foods, dyes, or preservatives -pregnant or trying to get pregnant -breast-feeding How should I use this medicine? Take this medicine by mouth with a glass of water. Follow the directions on the prescription label. If you only take this medicine once a day, take it at bedtime. Take your doses at regular intervals. Do not take your medicine more often than directed. Talk to your pediatrician regarding the use of this medicine in children. Special care may be needed. Overdosage: If you think you have taken too much of this medicine contact a poison control center or emergency room at once. NOTE: This medicine is only for you. Do not share this medicine with others. What if I miss a dose? If you miss a dose, take it as soon as you can. If it is almost time for your next dose, take only that dose. Do not take double or extra doses. What may interact with this medicine? -delavirdine -itraconazole -ketoconazole This list may not describe all possible interactions. Give your health care provider a list of all the medicines, herbs, non-prescription drugs, or dietary supplements you use. Also tell them if you smoke, drink alcohol, or use illegal drugs. Some items may interact with your medicine. What should I watch for while using this medicine? Tell your doctor or health care professional if your  condition does not start to get better or if it gets worse. Finish the full course of tablets prescribed, even if you feel better. Do not take with aspirin, ibuprofen or other antiinflammatory medicines. These can make your condition worse. Do not smoke cigarettes or drink alcohol. These cause irritation in your stomach and can increase the time it will take for ulcers to heal. If you get  black, tarry stools or vomit up what looks like coffee grounds, call your doctor or health care professional at once. You may have a bleeding ulcer. This medicine may cause a decrease in vitamin B12. You should make sure that you get enough vitamin B12 while you are taking this medicine. Discuss the foods you eat and the vitamins you take with your health care professional. What side effects may I notice from receiving this medicine? Side effects that you should report to your doctor or health care professional as soon as possible: -agitation, nervousness -confusion -hallucinations -skin rash, itching Side effects that usually do not require medical attention (report to your doctor or health care professional if they continue or are bothersome): -constipation -diarrhea -dizziness -headache This list may not describe all possible side effects. Call your doctor for medical advice about side effects. You may report side effects to FDA at 1-800-FDA-1088. Where should I keep my medicine? Keep out of the reach of children. Store at room temperature between 15 and 30 degrees C (59 and 86 degrees F). Do not freeze. Throw away any unused medicine after the expiration date. NOTE: This sheet is a summary. It may not cover all possible information. If you have questions about this medicine, talk to your doctor, pharmacist, or health care provider.  2019 Elsevier/Gold Standard (2017-02-19 13:17:50)

## 2018-11-08 NOTE — Addendum Note (Signed)
Addended by: Crist Fat on: 11/08/2018 11:22 AM   Modules accepted: Orders

## 2018-11-23 ENCOUNTER — Telehealth: Payer: Medicare Other | Admitting: Cardiology

## 2018-12-10 ENCOUNTER — Other Ambulatory Visit: Payer: Self-pay | Admitting: Cardiology

## 2018-12-13 ENCOUNTER — Ambulatory Visit: Payer: Medicare Other | Admitting: Cardiology

## 2018-12-13 ENCOUNTER — Other Ambulatory Visit: Payer: Self-pay | Admitting: Cardiology

## 2018-12-13 NOTE — Telephone Encounter (Signed)
Rx refill sent to pharmacy. 

## 2018-12-15 LAB — BASIC METABOLIC PANEL
BUN/Creatinine Ratio: 14 (ref 12–28)
BUN: 12 mg/dL (ref 8–27)
CO2: 26 mmol/L (ref 20–29)
Calcium: 9 mg/dL (ref 8.7–10.3)
Chloride: 97 mmol/L (ref 96–106)
Creatinine, Ser: 0.85 mg/dL (ref 0.57–1.00)
GFR calc Af Amer: 72 mL/min/{1.73_m2} (ref >59)
GFR calc non Af Amer: 62 mL/min/{1.73_m2} (ref >59)
Glucose: 207 mg/dL — ABNORMAL HIGH (ref 65–99)
Potassium: 4 mmol/L (ref 3.5–5.2)
Sodium: 140 mmol/L (ref 134–144)

## 2018-12-15 LAB — CBC
Hematocrit: 39.7 % (ref 34.0–46.6)
Hemoglobin: 13.7 g/dL (ref 11.1–15.9)
MCH: 30 pg (ref 26.6–33.0)
MCHC: 34.5 g/dL (ref 31.5–35.7)
MCV: 87 fL (ref 79–97)
Platelets: 312 10*3/uL (ref 150–450)
RBC: 4.57 x10E6/uL (ref 3.77–5.28)
RDW: 13.2 % (ref 11.7–15.4)
WBC: 10.6 10*3/uL (ref 3.4–10.8)

## 2018-12-15 LAB — PRO B NATRIURETIC PEPTIDE: NT-Pro BNP: 1209 pg/mL — ABNORMAL HIGH (ref 0–738)

## 2018-12-30 DIAGNOSIS — M533 Sacrococcygeal disorders, not elsewhere classified: Secondary | ICD-10-CM

## 2018-12-30 HISTORY — DX: Sacrococcygeal disorders, not elsewhere classified: M53.3

## 2019-01-23 ENCOUNTER — Other Ambulatory Visit: Payer: Self-pay | Admitting: Cardiology

## 2019-01-24 NOTE — Telephone Encounter (Signed)
Rx refill sent to pharmacy. 

## 2019-01-31 NOTE — Progress Notes (Signed)
Cardiology Office Note:    Date:  02/01/2019   ID:  Donna Ballard, DOB 05/13/1931, MRN [id number]  PCP:  Maris Berger, MD  Cardiologist:  Shirlee More, MD    Referring MD: Maris Berger, MD    ASSESSMENT:    1. Chronic diastolic heart failure ([place])   2. Hypertensive heart disease with chronic diastolic congestive heart failure ([place])   3. Coronary artery disease involving native coronary artery of native heart with unstable angina pectoris ([place])   4. Permanent atrial fibrillation   5. Chronic anticoagulation   6. Nonrheumatic aortic valve stenosis    PLAN:    In order of problems listed above:  1. Chronic diastolic heart failure -compensated I am concerned with her blood pressure out-of-control I asked her to start to weigh daily and I give her credit she does take her diuretic and sodium intake is supervises her family prepares meals continue her current diuretic 2. Hypertensive heart diseasew ith chronic diastolic congestive heart failure -poorly controlled hypertension I suspect the strain sensation up with her vision and pulsating in her head is due to uncontrolled hypertension we will start some bedtime clonidine as 1 of her other problems is trouble sleeping.  I have asked her to start checking home blood pressure again 3. CAD -stable continue treatment including anticoagulant clopidogrel beta-blocker and she refuses any lipid-lowering therapy 4. Persistent atrial fibrillation -stable rate controlled continue anticoagulant Eliquis once daily is better than none which should take her medications as directed 5. Chronic anticoagulation -continue her current anticoagulant, check CBC along with BMP and proBNP today   Next appointment: 3 months   Medication Adjustments/Labs and Tests Ordered: Current medicines are reviewed at length with the patient today.  Concerns regarding medicines are outlined above.  No orders of the defined types were placed in this encounter.  No orders  of the defined types were placed in this encounter.   Chief Complaint: 85 yo female presents for 3 month follow up of CAD, diastolic HF, atrial fibrillation.   History of Present Illness:    Donna Ballard is a 85 y.o. female with a hx of CAD NSTEMI with PCI and stent to LAD 39/7673, diastolic heart failure, atrial fibrillation, HTN, HLD last seen 08/23/18.  An echocardiogram performed 06/14/18 showed the presence of mild aortic valve stenosis mild mitral regurgitation moderate to severe left atrial enlargement moderate right atrial enlargement.  She was  last seen video virtual visit 11/08/2018.  She has refused lipid-lowering therapy.  Last lipid profile 10/06/2016 cholesterol 212 LDL 142 HDL 34 triglycerides 330.  Compliance with diet, lifestyle and medications: Yes  Today is not a good day should a dental extraction and then the force of getting into the office repeat blood pressure by me is 180/80.  She is not weighing herself at home she is not checking her blood pressure and her predominant complaint is she gets a pulsation and fullness in her face and eyes likely related to uncontrolled hypertension.  She also on her own is made decision to take Eliquis once a day and realizes she puts herself at risk for stroke.  Fortunately she has had no chest pain palpitation shortness of breath or edema. Past Medical History:  Diagnosis Date  . Arthritis   . Atrial fibrillation ([place])    a. dx 05/2018, rate control strategy for now.  Marland Kitchen CAD in native artery    a. NSTEMI 05/2018 s/p DES to LAD with residual dz treated medically.  Marland Kitchen  Chronic bronchitis ([facility])   . Chronic diastolic CHF (congestive heart failure) ([facility])   . Diabetes mellitus without complication ([facility])   . Frailty   . Gait instability   . GERD (gastroesophageal reflux disease)   . HLD (hyperlipidemia)   . HTN (hypertension)   . Hypertension   . Hypokalemia   . Insulin dependent diabetes mellitus ([facility])   . Muscle pain   . Normocytic  anemia   . Osteoarthritis   . PVC's (premature ventricular contractions)   . Swelling     Past Surgical History:  Procedure Laterality Date  . ABDOMINAL HYSTERECTOMY    . CARDIAC CATHETERIZATION    . CHOLECYSTECTOMY    . CORONARY ANGIOPLASTY    . CORONARY STENT INTERVENTION N/A 06/13/2018   Procedure: CORONARY STENT INTERVENTION;  Surgeon: Yvonne KendallEnd, Christopher, MD;  Location: [facility];  Service: Cardiovascular;  Laterality: N/A;  . LEFT HEART CATH AND CORONARY ANGIOGRAPHY N/A 06/13/2018   Procedure: LEFT HEART CATH AND CORONARY ANGIOGRAPHY;  Surgeon: Yvonne KendallEnd, Christopher, MD;  Location: [facility]VAS[facility] CV [facility];  Service: Cardiovascular;  Laterality: N/A;  . parithyroid surgery     09/2013  . TONSILLECTOMY      Current Medications: Current Meds  Medication Sig  . acetaminophen (TYLENOL) 500 MG tablet Take 500 mg by mouth daily as needed.  Marland Kitchen. albuterol (PROVENTIL HFA;VENTOLIN HFA) 108 (90 Base) MCG/ACT inhaler Inhale 2 puffs into the lungs every 6 (six) hours as needed for wheezing or shortness of breath.  Marland Kitchen. apixaban (ELIQUIS) 5 MG TABS tablet Take 5 mg by mouth daily after breakfast.  . carvedilol (COREG) 25 MG tablet Take 25 mg by mouth 2 (two) times daily with a meal.  . cholecalciferol (VITAMIN D3) 25 MCG (1000 UT) tablet Take 1,000 Units by mouth daily.  . clopidogrel (PLAVIX) 75 MG tablet TAKE 1 TABLET BY MOUTH ONCE DAILY.  . folic acid (FOLVITE) 800 MCG tablet Take 800 mcg by mouth daily.   Marland Kitchen. glucose blood test strip 1 each by Other route as needed for other. Use as instructed  . insulin glargine (LANTUS) 100 UNIT/ML injection Inject 10 Units into the skin daily.   . Insulin Syringe-Needle U-100 (INSULIN SYRINGE 1CC/31GX5/16") 31G X 5/16" 1 ML MISC Use one daily for DM2 E11.9 on insulin  . ipratropium-albuterol (DUONEB) 0.5-2.5 (3) MG/3ML SOLN Inhale 3 mLs into the lungs every 6 (six) hours as needed.   . Multiple Vitamins-Minerals (PRESERVISION AREDS PO) Take 1 capsule by mouth  every morning.  . nitroGLYCERIN (NITROSTAT) 0.4 MG SL tablet Place 0.4 mg under the tongue every 5 (five) minutes as needed for chest pain.  Marland Kitchen. nystatin (NYSTATIN) powder Apply topically as needed.  . polyethylene glycol (MIRALAX / GLYCOLAX) packet Take 17 g by mouth daily as needed.   . Potassium Chloride ER 20 MEQ TBCR Take 20 mEq by mouth 2 (two) times daily.  Marland Kitchen. Propylene Glycol (SYSTANE BALANCE OP) Apply 1 drop to eye 2 (two) times daily.  Marland Kitchen. torsemide (DEMADEX) 20 MG tablet TAKE 2 TABLETS BY MOUTH TWICE DAILY     Allergies:   Amlodipine besylate, Metformin, Celecoxib, Enalapril maleate, Estrogens, Fluvastatin, Furosemide, Hydrochlorothiazide, Irbesartan, Lisinopril, Losartan, Pantoprazole, Trandolapril, and Valsartan   Social History   Socioeconomic History  . Marital status: Widowed    Spouse name: Not on file  . Number of children: Not on file  . Years of education: Not on file  . Highest education level: Not on file  Occupational History  . Not  on file  Social Needs  . Financial resource strain: Not on file  . Food insecurity    Worry: Not on file    Inability: Not on file  . Transportation needs    Medical: Not on file    Non-medical: Not on file  Tobacco Use  . Smoking status: Never Smoker  . Smokeless tobacco: Never Used  Substance and Sexual Activity  . Alcohol use: No  . Drug use: No  . Sexual activity: Not on file  Lifestyle  . Physical activity    Days per week: Not on file    Minutes per session: Not on file  . Stress: Not on file  Relationships  . Social Musicianconnections    Talks on phone: Not on file    Gets together: Not on file    Attends religious service: Not on file    Active member of club or organization: Not on file    Attends meetings of clubs or organizations: Not on file    Relationship status: Not on file  Other Topics Concern  . Not on file  Social History Narrative   ** Merged History Encounter **         Family History: The patient's  family history includes Diabetes in her sister.   ROS:   Please see the history of present illness.    All other systems reviewed and are negative.  EKGs/Labs/Other Studies Reviewed:    The following studies were reviewed today:  Echo 05/2018 Left ventricle: The cavity size was normal. There was mild   concentric hypertrophy. Systolic function was normal. The   estimated ejection fraction was in the range of 60% to 65%. Wall   motion was normal; there were no regional wall motion   abnormalities. The study was not technically sufficient to allow   evaluation of LV diastolic dysfunction due to atrial   fibrillation. - Aortic valve: Trileaflet; moderately thickened, moderately   calcified leaflets. There was mild stenosis. There was mild   regurgitation. Valve area (VTI): 1.01 cm^2. Valve area (Vmax):   1.05 cm^2. Valve area (Vmean): 0.98 cm^2. - Mitral valve: Calcified annulus. There was mild regurgitation. - Left atrium: The atrium was moderately to severely dilated. - Right ventricle: Systolic function was mildly reduced. - Right atrium: The atrium was moderately dilated. - Tricuspid valve: There was mild regurgitation.  Cardiac cath 05/2018 Conclusions: 1. Severe single-vessel coronary artery disease, with 90% mid LAD stenosis and 80-90% D1 and D2 lesions. 2. Moderate, non-obstructive disease involving the proximal and mid LAD, mid LCx, and RCA. 3. Moderately elevated left ventricular filling pressure. 4. Successful PCI to 90% mid LAD stenosis using Resolute Onyx 3.0 x 12 mm drug-eluting stent (post-dilated to 3.4 mm) with 0% residual stenosis and TIMI-3 flow.  Given interposed aneurysmal dilation and non-critical appearance, the more distal 50-60% mid LAD stenosis was not intervened upon.   EKG:  EKG ordered today and personally reviewed.  The ekg ordered today demonstrates   Recent Labs: 06/12/2018: ALT 16; B Natriuretic Peptide 541.7 12/14/2018: BUN 12; Creatinine, Ser  0.85; Hemoglobin 13.7; NT-Pro BNP 1,209; Platelets 312; Potassium 4.0; Sodium 140  Recent Lipid Panel No results found for: CHOL, TRIG, HDL, CHOLHDL, VLDL, LDLCALC, LDLDIRECT  Physical Exam:    VS:  Pulse 78   Temp 98.8 F (37.1 C)   Ht 5\' 2"  (1.575 m)   Wt 217 lb 6.4 oz (98.6 kg)   SpO2 96%   BMI 39.76 kg/m  Wt Readings from Last 3 Encounters:  02/01/19 217 lb 6.4 oz (98.6 kg)  11/08/18 203 lb (92.1 kg)  08/23/18 204 lb 4 oz (92.6 kg)     GEN: Elderly woman obese unhappy well nourished, well developed in no acute distress HEENT: Normal NECK: No JVD; No carotid bruits LYMPHATICS: No lymphadenopathy CARDIAC: Irregular rhythm variable first heart sound , no murmurs, rubs, gallops RESPIRATORY:  Clear to auscultation without rales, wheezing or rhonchi  ABDOMEN: Soft, non-tender, non-distended MUSCULOSKELETAL:  No 1+ bilateral ankle to knee edema; No deformity  SKIN: Warm and dry NEUROLOGIC:  Alert and oriented x 3 PSYCHIATRIC:  Normal affect    Signed, Norman HerrlichBrian Aritza Brunet, MD  02/01/2019 2:38 PM    [facility]

## 2019-02-01 ENCOUNTER — Other Ambulatory Visit: Payer: Self-pay

## 2019-02-01 ENCOUNTER — Encounter: Payer: Self-pay | Admitting: Cardiology

## 2019-02-01 ENCOUNTER — Ambulatory Visit (INDEPENDENT_AMBULATORY_CARE_PROVIDER_SITE_OTHER): Payer: Medicare Other | Admitting: Cardiology

## 2019-02-01 VITALS — BP 180/80 | HR 78 | Temp 98.8°F | Ht 62.0 in | Wt 217.4 lb

## 2019-02-01 DIAGNOSIS — I5032 Chronic diastolic (congestive) heart failure: Secondary | ICD-10-CM | POA: Diagnosis not present

## 2019-02-01 DIAGNOSIS — I4821 Permanent atrial fibrillation: Secondary | ICD-10-CM | POA: Diagnosis not present

## 2019-02-01 DIAGNOSIS — I110 Hypertensive heart disease with heart failure: Secondary | ICD-10-CM

## 2019-02-01 DIAGNOSIS — I350 Nonrheumatic aortic (valve) stenosis: Secondary | ICD-10-CM

## 2019-02-01 DIAGNOSIS — I25110 Atherosclerotic heart disease of native coronary artery with unstable angina pectoris: Secondary | ICD-10-CM | POA: Diagnosis not present

## 2019-02-01 DIAGNOSIS — Z7901 Long term (current) use of anticoagulants: Secondary | ICD-10-CM

## 2019-02-01 MED ORDER — CLONIDINE HCL 0.1 MG PO TABS: 0.1000 mg | ORAL_TABLET | Freq: Every day | ORAL | 2 refills | Status: DC

## 2019-02-01 NOTE — Patient Instructions (Signed)
Medication Instructions:  START Clonidine 0.1mg  at bedtime. It is advised that you take your Eliquis twice daily as prescribed.  If you need a refill on your cardiac medications before your next appointment, please call your pharmacy.   Lab work: CBC, BMP, Pro BNP were drawn today.   If you have labs (blood work) drawn today and your tests are completely normal, you will receive your results only by: Marland Kitchen. MyChart Message (if you have MyChart) OR . A paper copy in the mail If you have any lab test that is abnormal or we need to change your treatment, we will call you to review the results.  Testing/Procedures: None ordered today.  Follow-Up: At [facility], you and your health needs are our priority.  As part of our continuing mission to provide you with exceptional heart care, we have created designated Provider Care Teams.  These Care Teams include your primary Cardiologist (physician) and Advanced Practice Providers (APPs -  Physician Assistants and Nurse Practitioners) who all work together to provide you with the care you need, when you need it. . You will need a follow up appointment in 3 months with Norman HerrlichBrian Astella Desir, MD  Any Other Special Instructions Will Be Listed Below (If Applicable). Please check blood pressure and weight daily.   [facility]  Tips to measure your blood pressure correctly  To determine whether you have hypertension, a medical professional will take a blood pressure reading. How you prepare for the test, the position of your arm, and other factors can change a blood pressure reading by 10% or more. That could be enough to hide high blood pressure, start you on a drug you don't really need, or lead your doctor to incorrectly adjust your medications. National and international guidelines offer specific instructions for measuring blood pressure. If a doctor, nurse, or medical assistant isn't doing it right, don't hesitate to ask him or her to get with the guidelines.  Here's what you can do to ensure a correct reading: . Don't drink a caffeinated beverage or smoke during the 30 minutes before the test. . Sit quietly for five minutes before the test begins. . During the measurement, sit in a chair with your feet on the floor and your arm supported so your elbow is at about heart level. . The inflatable part of the cuff should completely cover at least 80% of your upper arm, and the cuff should be placed on bare skin, not over a shirt. . Don't talk during the measurement. . Have your blood pressure measured twice, with a brief break in between. If the readings are different by 5 points or more, have it done a third time. There are times to break these rules. If you sometimes feel lightheaded when getting out of bed in the morning or when you stand after sitting, you should have your blood pressure checked while seated and then while standing to see if it falls from one position to the next. Because blood pressure varies throughout the day, your doctor will rarely diagnose hypertension on the basis of a single reading. Instead, he or she will want to confirm the measurements on at least two occasions, usually within a few weeks of one another. The exception to this rule is if you have a blood pressure reading of 180/110 mm Hg or higher. A result this high usually calls for prompt treatment. It's also a good idea to have your blood pressure measured in both arms at least once, since the reading  in one arm (usually the right) may be higher than that in the left. A 2014 study in The American Journal of Medicine of nearly 3,400 people found average arm- to-arm differences in systolic blood pressure of about 5 points. The higher number should be used to make treatment decisions. In 2017, new guidelines from the American Heart Association, the [employer] of [employer], and nine other health organizations lowered the diagnosis of high blood pressure to 130/80 mm Hg or  higher for all adults. The guidelines also redefined the various blood pressure categories to now include normal, elevated, Stage 1 hypertension, Stage 2 hypertension, and hypertensive crisis (see "Blood pressure categories"). Blood pressure categories  Blood pressure category SYSTOLIC (upper number)  DIASTOLIC (lower number)  Normal Less than 120 mm Hg and Less than 80 mm Hg  Elevated 120-129 mm Hg and Less than 80 mm Hg  High blood pressure: Stage 1 hypertension 130-139 mm Hg or 80-89 mm Hg  High blood pressure: Stage 2 hypertension 140 mm Hg or higher or 90 mm Hg or higher  Hypertensive crisis (consult your doctor immediately) Higher than 180 mm Hg and/or Higher than 120 mm Hg  Source: American Heart Association and American Stroke Association. For more on getting your blood pressure under control, buy Controlling Your Blood Pressure, a Special Health Report from [employer] [employer].  Daily Weight Record It is important to weigh yourself daily. To do this:  Make sure you use a reliable scale. Use the same scale each day.  Keep this daily weight chart near your scale.  Weigh yourself each morning at the same time.  Before weighing yourself: ? Take off your shoes. ? Make sure you are wearing the same amount of clothing each day.  Write down your weight in the spaces on the form.  Compare today's weight to yesterday's weight.  Bring this form with you to your follow-up visits with your health care provider. Call your health care provider if you have concerns about your weight, including rapid weight gain or loss. Date: ________ Weight: ____________________ Date: ________ Weight: ____________________ Date: ________ Weight: ____________________ Date: ________ Weight: ____________________ Date: ________ Weight: ____________________ Date: ________ Weight: ____________________ Date: ________ Weight: ____________________ Date: ________ Weight: ____________________ Date: ________  Weight: ____________________ Date: ________ Weight: ____________________ Date: ________ Weight: ____________________ Date: ________ Weight: ____________________ Date: ________ Weight: ____________________ Date: ________ Weight: ____________________ Date: ________ Weight: ____________________ Date: ________ Weight: ____________________ Date: ________ Weight: ____________________ Date: ________ Weight: ____________________ Date: ________ Weight: ____________________ Date: ________ Weight: ____________________ Date: ________ Weight: ____________________ Date: ________ Weight: ____________________ Date: ________ Weight: ____________________ Date: ________ Weight: ____________________ Date: ________ Weight: ____________________ Date: ________ Weight: ____________________ Date: ________ Weight: ____________________ Date: ________ Weight: ____________________ Date: ________ Weight: ____________________ Date: ________ Weight: ____________________ Date: ________ Weight: ____________________ Date: ________ Weight: ____________________ Date: ________ Weight: ____________________ Date: ________ Weight: ____________________ Date: ________ Weight: ____________________ Date: ________ Weight: ____________________ Date: ________ Weight: ____________________ Date: ________ Weight: ____________________ Date: ________ Weight: ____________________ Date: ________ Weight: ____________________ Date: ________ Weight: ____________________ Date: ________ Weight: ____________________ Date: ________ Weight: ____________________ Date: ________ Weight: ____________________ Date: ________ Weight: ____________________ Date: ________ Weight: ____________________ Date: ________ Weight: ____________________ Date: ________ Weight: ____________________ Date: ________ Weight: ____________________ Date: ________ Weight: ____________________ This information is not intended to replace advice given to you by your health care provider.  Make sure you discuss any questions you have with your health care provider. Document Released: 09/17/2006 Document Revised: 07/05/2017 Document Reviewed: 07/05/2017 Elsevier Patient Education  2020 Elsevier Inc. Clonidine tablets  What is this medicine? CLONIDINE (KLOE ni deen) is used to treat high blood pressure. This medicine may be used for other purposes; ask your health care provider or pharmacist if you have questions. COMMON BRAND NAME(S): Catapres What should I tell my health care provider before I take this medicine? They need to know if you have any of these conditions:  kidney disease  an unusual or allergic reaction to clonidine, other medicines, foods, dyes, or preservatives  pregnant or trying to get pregnant  breast-feeding How should I use this medicine? Take this medicine by mouth with a glass of water. Follow the directions on the prescription label. Take your doses at regular intervals. Do not take your medicine more often than directed. Do not suddenly stop taking this medicine. You must gradually reduce the dose or you may get a dangerous increase in blood pressure. Ask your doctor or health care professional for advice. Talk to your pediatrician regarding the use of this medicine in children. Special care may be needed. Overdosage: If you think you have taken too much of this medicine contact a poison control center or emergency room at once. NOTE: This medicine is only for you. Do not share this medicine with others. What if I miss a dose? If you miss a dose, take it as soon as you can. If it is almost time for your next dose, take only that dose. Do not take double or extra doses. What may interact with this medicine? Do not take this medicine with any of the following medications:  MAOIs like Carbex, Eldepryl, Marplan, Nardil, and Parnate This medicine may also interact with the following medications:  barbiturate medicines for inducing sleep or treating  seizures like phenobarbital  certain medicines for blood pressure, heart disease, irregular heart beat  certain medicines for depression, anxiety, or psychotic disturbances  prescription pain medicines This list may not describe all possible interactions. Give your health care provider a list of all the medicines, herbs, non-prescription drugs, or dietary supplements you use. Also tell them if you smoke, drink alcohol, or use illegal drugs. Some items may interact with your medicine. What should I watch for while using this medicine? Visit your doctor or health care professional for regular checks on your progress. Check your heart rate and blood pressure regularly while you are taking this medicine. Ask your doctor or health care professional what your heart rate should be and when you should contact him or her. You may get drowsy or dizzy. Do not drive, use machinery, or do anything that needs mental alertness until you know how this medicine affects you. To avoid dizzy or fainting spells, do not stand or sit up quickly, especially if you are an older person. Alcohol can make you more drowsy and dizzy. Avoid alcoholic drinks. Your mouth may get dry. Chewing sugarless gum or sucking hard candy, and drinking plenty of water will help. Do not treat yourself for coughs, colds, or pain while you are taking this medicine without asking your doctor or health care professional for advice. Some ingredients may increase your blood pressure. If you are going to have surgery tell your doctor or health care professional that you are taking this medicine. What side effects may I notice from receiving this medicine? Side effects that you should report to your doctor or health care professional as soon as possible:  allergic reactions like skin rash, itching or hives, swelling of the face, lips, or tongue  anxiety, nervousness  chest pain  depression  fast, irregular heartbeat  swelling of feet or legs   unusually weak or tired Side effects that usually do not require medical attention (report to your doctor or health care professional if they continue or are bothersome):  change in sex drive or performance  constipation  headache This list may not describe all possible side effects. Call your doctor for medical advice about side effects. You may report side effects to FDA at 1-800-FDA-1088. Where should I keep my medicine? Keep out of the reach of children. Store at room temperature between 15 and 30 degrees C (59 and 86 degrees F). Protect from light. Keep container tightly closed. Throw away any unused medicine after the expiration date. NOTE: This sheet is a summary. It may not cover all possible information. If you have questions about this medicine, talk to your doctor, pharmacist, or health care provider.  2020 Elsevier/Gold Standard (2010-12-31 13:01:28)

## 2019-02-01 NOTE — Addendum Note (Signed)
Addended by: Shirlee More on: 02/01/2019 02:48 PM   Modules accepted: Orders

## 2019-02-01 NOTE — Addendum Note (Signed)
Addended by: Shirlee More on: 02/01/2019 02:46 PM   Modules accepted: Orders

## 2019-02-02 LAB — BASIC METABOLIC PANEL
BUN/Creatinine Ratio: 19 (ref 12–28)
BUN: 14 mg/dL (ref 8–27)
CO2: 26 mmol/L (ref 20–29)
Calcium: 9.6 mg/dL (ref 8.7–10.3)
Chloride: 102 mmol/L (ref 96–106)
Creatinine, Ser: 0.73 mg/dL (ref 0.57–1.00)
GFR calc Af Amer: 86 mL/min/{1.73_m2} (ref >59)
GFR calc non Af Amer: 75 mL/min/{1.73_m2} (ref >59)
Glucose: 157 mg/dL — ABNORMAL HIGH (ref 65–99)
Potassium: 4.3 mmol/L (ref 3.5–5.2)
Sodium: 143 mmol/L (ref 134–144)

## 2019-02-02 LAB — CBC
Hematocrit: 42.2 % (ref 34.0–46.6)
Hemoglobin: 13.3 g/dL (ref 11.1–15.9)
MCH: 28 pg (ref 26.6–33.0)
MCHC: 31.5 g/dL (ref 31.5–35.7)
MCV: 89 fL (ref 79–97)
Platelets: 290 10*3/uL (ref 150–450)
RBC: 4.75 x10E6/uL (ref 3.77–5.28)
RDW: 13.6 % (ref 11.7–15.4)
WBC: 10.6 10*3/uL (ref 3.4–10.8)

## 2019-02-02 LAB — PRO B NATRIURETIC PEPTIDE: NT-Pro BNP: 1404 pg/mL — ABNORMAL HIGH (ref 0–738)

## 2019-02-06 ENCOUNTER — Telehealth: Payer: Self-pay | Admitting: Cardiology

## 2019-02-06 NOTE — Telephone Encounter (Signed)
The medicine he prescribed her last week is making her BP go up

## 2019-02-06 NOTE — Telephone Encounter (Signed)
Telephone call to patient. Spoke with her and her daughter Langley Gauss. Patient thinks clonidine is making her BP higher but unable to give readings. Instructed patient and daughter to take BP daily at the same time after taking medsa and resting 5-10 min. Record for 3 days and call these back in to the office. Patient and daughter agreeable to plan.

## 2019-02-14 ENCOUNTER — Telehealth: Payer: Self-pay | Admitting: Cardiology

## 2019-02-14 NOTE — Telephone Encounter (Signed)
I would make no changes in treatment at this time

## 2019-02-14 NOTE — Telephone Encounter (Signed)
Patient BP is as follows: 7/20: 128/85 7/21: 159/97 7/22: 151/91 7/23: 144/59 7/24: 160/96

## 2019-02-14 NOTE — Telephone Encounter (Signed)
Information relayed to patients daughter Langley Gauss to continue on current plan. No further questions at this time.

## 2019-03-18 ENCOUNTER — Other Ambulatory Visit: Payer: Self-pay | Admitting: Cardiology

## 2019-04-12 ENCOUNTER — Other Ambulatory Visit: Payer: Self-pay | Admitting: Cardiology

## 2019-04-29 ENCOUNTER — Other Ambulatory Visit: Payer: Self-pay | Admitting: Cardiology

## 2019-05-01 NOTE — Telephone Encounter (Signed)
Rx refill sent to pharmacy. 

## 2019-05-04 ENCOUNTER — Other Ambulatory Visit: Payer: Self-pay | Admitting: Cardiology

## 2019-05-10 ENCOUNTER — Ambulatory Visit: Payer: Medicare Other | Admitting: Cardiology

## 2019-05-10 ENCOUNTER — Telehealth: Payer: Self-pay | Admitting: Cardiology

## 2019-05-10 NOTE — Telephone Encounter (Signed)
Attempted to return call to patient's daughter, Langley Gauss, per [facility] with no answer. Unable to leave message, will continue efforts.

## 2019-05-10 NOTE — Telephone Encounter (Signed)
Patient's daughter, Langley Gauss would like to come in with patient for her appointment Monday 10/26  Please call Daughter, Langley Gauss to discuss

## 2019-05-12 ENCOUNTER — Telehealth: Payer: Self-pay | Admitting: Cardiology

## 2019-05-12 NOTE — Telephone Encounter (Signed)
Donna Ballard informed that since the patient does not have a physical or mental deficit, she will need to come into the office for her appointment alone on Monday, 05/15/2019. Percival Spanish to send an updated medication list in with the patient and we will call her with any further questions. Denise verbalized understanding. No further questions.

## 2019-05-12 NOTE — Telephone Encounter (Signed)
Patient returned your call.  Please call

## 2019-05-12 NOTE — Telephone Encounter (Signed)
Duplicate encounter. Please see previous encounter.  

## 2019-05-12 NOTE — Telephone Encounter (Signed)
Left message for Langley Gauss to return call to discuss.

## 2019-05-15 ENCOUNTER — Encounter: Payer: Self-pay | Admitting: Cardiology

## 2019-05-15 ENCOUNTER — Other Ambulatory Visit: Payer: Self-pay

## 2019-05-15 ENCOUNTER — Ambulatory Visit (INDEPENDENT_AMBULATORY_CARE_PROVIDER_SITE_OTHER): Payer: Medicare Other | Admitting: Cardiology

## 2019-05-15 VITALS — BP 160/70 | HR 60 | Ht 62.0 in | Wt 219.0 lb

## 2019-05-15 DIAGNOSIS — I25110 Atherosclerotic heart disease of native coronary artery with unstable angina pectoris: Secondary | ICD-10-CM

## 2019-05-15 DIAGNOSIS — I110 Hypertensive heart disease with heart failure: Secondary | ICD-10-CM | POA: Diagnosis not present

## 2019-05-15 DIAGNOSIS — I5032 Chronic diastolic (congestive) heart failure: Secondary | ICD-10-CM

## 2019-05-15 DIAGNOSIS — I4819 Other persistent atrial fibrillation: Secondary | ICD-10-CM | POA: Diagnosis not present

## 2019-05-15 NOTE — Patient Instructions (Addendum)
Medication Instructions:  Your physician recommends that you continue on your current medications as directed. Please refer to the Current Medication list given to you today.  *If you need a refill on your cardiac medications before your next appointment, please call your pharmacy*  Lab Work: Your physician recommends that you return for lab work in: TODAY BMP,Magnesium  If you have labs (blood work) drawn today and your tests are completely normal, you will receive your results only by: Marland Kitchen MyChart Message (if you have MyChart) OR . A paper copy in the mail If you have any lab test that is abnormal or we need to change your treatment, we will call you to review the results.  Testing/Procedures: NOne  Follow-Up: At [facility], you and your health needs are our priority.  As part of our continuing mission to provide you with exceptional heart care, we have created designated Provider Care Teams.  These Care Teams include your primary Cardiologist (physician) and Advanced Practice Providers (APPs -  Physician Assistants and Nurse Practitioners) who all work together to provide you with the care you need, when you need it.  Your next appointment:   1 month  The format for your next appointment:   In Person  Provider:   Shirlee More, MD  Other Instructions

## 2019-05-15 NOTE — Progress Notes (Signed)
Cardiology Office Note:    Date:  05/15/2019   ID:  Donna Ballard, DOB [birth year], MRN [id number]  PCP:  Maris Berger, MD  Cardiologist:  Shirlee More, MD  Electrophysiologist:  None   Referring MD: Maris Berger, MD   Chief Complaint  Patient presents with   Follow-up    History of Present Illness:    Donna Ballard is a 85 y.o. female with a hx of of CAD NSTEMI with PCI and stent to LAD 40/9811, diastolic heart failure, atrial fibrillation, HTN, HLD, Mild aortic valve stenosis, mild mitral regurgitation last seen7/15/2020.  During her visit she was started on 0.1mg  at bedtime.  She was also asked to weigh herself daily as well as take her blood pressure daily.  The patient tells me today that she really has not been wanting her to have daily and she does have a single blood pressure reading of 165/11mmHg. in addition she notes that he had not taking her Eliquis for 3 days as she feels that it makes her nauseous.   No other complaints at this time.  Past Medical History:  Diagnosis Date   Arthritis    Atrial fibrillation ([place])    a. dx 05/2018, rate control strategy for now.   CAD in native artery    a. NSTEMI 05/2018 s/p DES to LAD with residual dz treated medically.   Chronic bronchitis (HCC)    Chronic diastolic CHF (congestive heart failure) (HCC)    Diabetes mellitus without complication (HCC)    Frailty    Gait instability    GERD (gastroesophageal reflux disease)    HLD (hyperlipidemia)    HTN (hypertension)    Hypertension    Hypokalemia    Insulin dependent diabetes mellitus    Muscle pain    Normocytic anemia    Osteoarthritis    PVC's (premature ventricular contractions)    Swelling     Past Surgical History:  Procedure Laterality Date   ABDOMINAL HYSTERECTOMY     CARDIAC CATHETERIZATION     CHOLECYSTECTOMY     CORONARY ANGIOPLASTY     CORONARY STENT INTERVENTION N/A 06/13/2018   Procedure: CORONARY STENT INTERVENTION;   Surgeon: Nelva Bush, MD;  Location: [place] [facility];  Service: Cardiovascular;  Laterality: N/A;   LEFT HEART CATH AND CORONARY ANGIOGRAPHY N/A 06/13/2018   Procedure: LEFT HEART CATH AND CORONARY ANGIOGRAPHY;  Surgeon: Nelva Bush, MD;  Location: [place] [facility];  Service: Cardiovascular;  Laterality: N/A;   parithyroid surgery     09/2013   TONSILLECTOMY      Current Medications: Current Meds  Medication Sig   acetaminophen (TYLENOL) 500 MG tablet Take 500 mg by mouth daily as needed.   albuterol (PROVENTIL HFA;VENTOLIN HFA) 108 (90 Base) MCG/ACT inhaler Inhale 2 puffs into the lungs every 6 (six) hours as needed for wheezing or shortness of breath.   albuterol (VENTOLIN HFA) 108 (90 Base) MCG/ACT inhaler Inhale 1 puff into the lungs daily as needed for wheezing or shortness of breath.   carvedilol (COREG) 25 MG tablet Take 25 mg by mouth 2 (two) times daily with a meal.   cholecalciferol (VITAMIN D3) 25 MCG (1000 UT) tablet Take 1,000 Units by mouth daily.   clopidogrel (PLAVIX) 75 MG tablet TAKE 1 TABLET BY MOUTH ONCE DAILY   ELIQUIS 5 MG TABS tablet TAKE 1 TABLET BY MOUTH TWICE DAILY.   fluticasone (FLONASE) 50 MCG/ACT nasal spray USE ONE SPRAY IN EACH NOSTRIL TWICE DAILY  folic acid (FOLVITE) 800 MCG tablet Take 800 mcg by mouth daily.    glucose blood test strip 1 each by Other route as needed for other. Use as instructed   insulin glargine (LANTUS) 100 UNIT/ML injection Inject 10 Units into the skin daily.    Insulin Syringe-Needle U-100 (INSULIN SYRINGE 1CC/31GX5/16") 31G X 5/16" 1 ML MISC Use one daily for DM2 E11.9 on insulin   Multiple Vitamins-Minerals (PRESERVISION AREDS PO) Take 1 capsule by mouth every morning.   nitroGLYCERIN (NITROSTAT) 0.4 MG SL tablet Place 0.4 mg under the tongue every 5 (five) minutes as needed for chest pain.   nystatin (NYSTATIN) powder Apply topically as needed.   polyethylene glycol (MIRALAX / GLYCOLAX) packet  Take 17 g by mouth daily as needed.    Potassium Chloride ER 20 MEQ TBCR Take 20 mEq by mouth 2 (two) times daily.   Propylene Glycol (SYSTANE BALANCE OP) Apply 1 drop to eye 2 (two) times daily.   torsemide (DEMADEX) 20 MG tablet TAKE 2 TABLETS BY MOUTH TWICE DAILY.     Allergies:   Amlodipine besylate, Metformin, Celecoxib, Enalapril maleate, Estrogens, Fluvastatin, Furosemide, Hydrochlorothiazide, Irbesartan, Lisinopril, Losartan, Pantoprazole, Trandolapril, and Valsartan   Social History   Socioeconomic History   Marital status: Widowed    Spouse name: Not on file   Number of children: Not on file   Years of education: Not on file   Highest education level: Not on file  Occupational History   Not on file  Social Needs   Financial resource strain: Not on file   Food insecurity    Worry: Not on file    Inability: Not on file   Transportation needs    Medical: Not on file    Non-medical: Not on file  Tobacco Use   Smoking status: Never Smoker   Smokeless tobacco: Never Used  Substance and Sexual Activity   Alcohol use: No   Drug use: No   Sexual activity: Not on file  Lifestyle   Physical activity    Days per week: Not on file    Minutes per session: Not on file   Stress: Not on file  Relationships   Social connections    Talks on phone: Not on file    Gets together: Not on file    Attends religious service: Not on file    Active member of club or organization: Not on file    Attends meetings of clubs or organizations: Not on file    Relationship status: Not on file  Other Topics Concern   Not on file  Social History Narrative   ** Merged History Encounter **         Family History: The patient's family history includes Diabetes in her sister.  ROS:   Review of Systems  Constitution: Negative for decreased appetite, fever and weight gain.  HENT: Negative for congestion, ear discharge, hoarse voice and sore throat.   Eyes: Negative for  discharge, redness, vision loss in right eye and visual halos.  Cardiovascular: Negative for chest pain, dyspnea on exertion, leg swelling, orthopnea and palpitations.  Respiratory: Negative for cough, hemoptysis, shortness of breath and snoring.   Endocrine: Negative for heat intolerance and polyphagia.  Hematologic/Lymphatic: Negative for bleeding problem. Does not bruise/bleed easily.  Skin: Negative for flushing, nail changes, rash and suspicious lesions.  Musculoskeletal: Negative for arthritis, joint pain, muscle cramps, myalgias, neck pain and stiffness.  Gastrointestinal: Negative for abdominal pain, bowel incontinence, diarrhea and excessive appetite.  Genitourinary: Negative for decreased libido, genital sores and incomplete emptying.  Neurological: Negative for brief paralysis, focal weakness, headaches and loss of balance.  Psychiatric/Behavioral: Negative for altered mental status, depression and suicidal ideas.  Allergic/Immunologic: Negative for HIV exposure and persistent infections.    EKGs/Labs/Other Studies Reviewed:    The following studies were reviewed today:  EKG: Atrial fibrillation, heart rate 66 bpm incomplete right bundle branch block nonspecific ST changes.   Recent Labs: 06/12/2018: ALT 16; B Natriuretic Peptide 541.7 02/01/2019: BUN 14; Creatinine, Ser 0.73; Hemoglobin 13.3; NT-Pro BNP 1,404; Platelets 290; Potassium 4.3; Sodium 143  Recent Lipid Panel No results found for: CHOL, TRIG, HDL, CHOLHDL, VLDL, LDLCALC, LDLDIRECT  Physical Exam:    VS:  BP (!) 160/70 (BP Location: Right Arm, Patient Position: Sitting, Cuff Size: Normal)    Pulse 60    Ht  (1.575 m)    Wt 219 lb (99.3 kg)    SpO2 97%    BMI 40.06 kg/m     Wt Readings from Last 3 Encounters:  05/15/19 219 lb (99.3 kg)  02/01/19 217 lb 6.4 oz (98.6 kg)  11/08/18 203 lb (92.1 kg)     GEN: Well nourished, well developed in no acute distress HEENT: Normal NECK: No JVD; No carotid  bruits LYMPHATICS: No lymphadenopathy CARDIAC: S1S2 noted,RRR, no murmurs, rubs, gallops RESPIRATORY:  Clear to auscultation without rales, wheezing or rhonchi  ABDOMEN: Soft, non-tender, non-distended, +bowel sounds, no guarding. EXTREMITIES: No edema, No cyanosis, no clubbing MUSCULOSKELETAL:  No edema; No deformity  SKIN: Warm and dry NEUROLOGIC:  Alert and oriented x 3, non-focal PSYCHIATRIC:  Normal affect, good insight  ASSESSMENT:    1. Hypertensive heart disease with chronic diastolic congestive heart failure (HCC)   2. Chronic diastolic heart failure (HCC)   3. Persistent atrial fibrillation (HCC)   4. Coronary artery disease involving native coronary artery of native heart with unstable angina pectoris (HCC)   5. Morbid obesity (HCC)    PLAN:      1. Her blood pressure is elevated in the office today.  However she is not taking her clonidine daily at bedtime as she was prescribed, she also did state that she held her torsemide dose today because she did not want to be going to the bathroom frequently while at the doctor's.  I did stress with the patient that she needs to take the clonidine 0.1 mg every night as well as take her torsemide as prescribed.  She was educated on decreasing salt intake however she tells me that her family does the cooking and from what she knows they do not add a lot of salt.  2.  She is in atrial fibrillation today her ventricular rate is controlled .  We will continue her on her carvedilol at this time I have educated patient that is important for her to take Eliquis 5 mg twice daily.  At this point she says she is only willing to take this medication 5 mg daily.  3.  The patient understands the need to lose weight with diet and exercise. We have discussed specific strategies for this.  The patient is in agreement with the above plan. The patient left the office in stable condition.  The patient will follow up in 1 months with Dr. Dulce Sellar.     Medication Adjustments/Labs and Tests Ordered: Current medicines are reviewed at length with the patient today.  Concerns regarding medicines are outlined above.  Orders Placed This Encounter  Procedures  Basic Metabolic Panel (BMET)   Magnesium   EKG 12-Lead   No orders of the defined types were placed in this encounter.   Patient Instructions  Medication Instructions:  Your physician recommends that you continue on your current medications as directed. Please refer to the Current Medication list given to you today.  *If you need a refill on your cardiac medications before your next appointment, please call your pharmacy*  Lab Work: Your physician recommends that you return for lab work in: TODAY BMP,Magnesium  If you have labs (blood work) drawn today and your tests are completely normal, you will receive your results only by:  MyChart Message (if you have MyChart) OR  A paper copy in the mail If you have any lab test that is abnormal or we need to change your treatment, we will call you to review the results.  Testing/Procedures: NOne  Follow-Up: At [facility], you and your health needs are our priority.  As part of our continuing mission to provide you with exceptional heart care, we have created designated Provider Care Teams.  These Care Teams include your primary Cardiologist (physician) and Advanced Practice Providers (APPs -  Physician Assistants and Nurse Practitioners) who all work together to provide you with the care you need, when you need it.  Your next appointment:   1 month  The format for your next appointment:   In Person  Provider:   Norman HerrlichBrian Munley, MD  Other Instructions      Adopting a Healthy Lifestyle.  Know what a healthy weight is for you (roughly BMI <25) and aim to maintain this   Aim for 7+ servings of fruits and vegetables daily   65-80+ fluid ounces of water or unsweet tea for healthy kidneys   Limit to max 1 drink of  alcohol per day; avoid smoking/tobacco   Limit animal fats in diet for cholesterol and heart health - choose grass fed whenever available   Avoid highly processed foods, and foods high in saturated/trans fats   Aim for low stress - take time to unwind and care for your mental health   Aim for 150 min of moderate intensity exercise weekly for heart health, and weights twice weekly for bone health   Aim for 7-9 hours of sleep daily   When it comes to diets, agreement about the perfect plan isnt easy to find, even among the experts. Experts at the [employer] developed an idea known as the Healthy Eating Plate. Just imagine a plate divided into logical, healthy portions.   The emphasis is on diet quality:   Load up on vegetables and fruits - one-half of your plate: Aim for color and variety, and remember that potatoes dont count.   Go for whole grains - one-quarter of your plate: Whole wheat, barley, wheat berries, quinoa, oats, brown rice, and foods made with them. If you want pasta, go with whole wheat pasta.   Protein power - one-quarter of your plate: Fish, chicken, beans, and nuts are all healthy, versatile protein sources. Limit red meat.   The diet, however, does go beyond the plate, offering a few other suggestions.   Use healthy plant oils, such as olive, canola, soy, corn, sunflower and peanut. Check the labels, and avoid partially hydrogenated oil, which have unhealthy trans fats.   If youre thirsty, drink water. Coffee and tea are good in moderation, but skip sugary drinks and limit milk and dairy products to one or two daily  servings.   The type of carbohydrate in the diet is more important than the amount. Some sources of carbohydrates, such as vegetables, fruits, whole grains, and beans-are healthier than others.   Finally, stay active  Signed, Thomasene Ripple, DO  05/15/2019 10:47 PM    [facility]

## 2019-05-16 LAB — BASIC METABOLIC PANEL
BUN/Creatinine Ratio: 16 (ref 12–28)
BUN: 14 mg/dL (ref 8–27)
CO2: 25 mmol/L (ref 20–29)
Calcium: 9.3 mg/dL (ref 8.7–10.3)
Chloride: 99 mmol/L (ref 96–106)
Creatinine, Ser: 0.88 mg/dL (ref 0.57–1.00)
GFR calc Af Amer: 69 mL/min/{1.73_m2} (ref >59)
GFR calc non Af Amer: 60 mL/min/{1.73_m2} (ref >59)
Glucose: 148 mg/dL — ABNORMAL HIGH (ref 65–99)
Potassium: 4.3 mmol/L (ref 3.5–5.2)
Sodium: 140 mmol/L (ref 134–144)

## 2019-05-16 LAB — MAGNESIUM: Magnesium: 2.3 mg/dL (ref 1.6–2.3)

## 2019-06-26 ENCOUNTER — Other Ambulatory Visit: Payer: Self-pay | Admitting: Cardiology

## 2019-06-27 NOTE — Progress Notes (Signed)
Cardiology Office Note:    Date:  06/28/2019   ID:  Donna Ballard, DOB 1930/08/29, MRN [id number]  PCP:  Maris Berger, MD  Cardiologist:  Shirlee More, MD    Referring MD: Maris Berger, MD    ASSESSMENT:    1. Hypertensive heart disease with chronic diastolic congestive heart failure ([place])   2. Chronic diastolic heart failure (HCC)   3. Permanent atrial fibrillation ([place])   4. Chronic anticoagulation   5. Coronary artery disease involving native coronary artery of native heart with unstable angina pectoris ([place])    PLAN:    In order of problems listed above:  1. Her heart failure is worsened more short of breath New York Heart Association class II class III will increase her diuretic check labs proBNP BNP level.  Hypertension stable and will continue her current treatment including her diuretic beta-blocker 2. A. fib rate controlled continue beta-blocker and anticoagulant 3. Stable CAD having no angina on current medical treatment New York Heart Association class I note she refuses lipid-lowering treatment and will continue combined anticoagulant antiplatelet   Next appointment: 3 months   Medication Adjustments/Labs and Tests Ordered: Current medicines are reviewed at length with the patient today.  Concerns regarding medicines are outlined above.  No orders of the defined types were placed in this encounter.  No orders of the defined types were placed in this encounter.   Chief Complaint  Patient presents with  . 1 month follow up  . Follow-up  . Congestive Heart Failure  . Atrial Fibrillation  . Anticoagulation    History of Present Illness:    Donna Ballard is a 85 y.o. female with a hx of CAD NSTEMI with PCI and stent to LAD 93/8182, diastolic heart failure, atrial fibrillation, HTN, HLD, Mild aortic valve stenosis, mild mitral regurgitation  last seen 05/15/2019. Compliance with diet, lifestyle and medications: Yes  Overall she is done well but she finds himself  a little bit more edematous little bit more breathless with activity more trouble ambulating with weakness and shortness of breath and has orthopnea when she first gets into bed.  We will increase the dose of her diuretic and take an extra tablet of torsemide every other day recheck labs and follow-up in 3 months.  She has had no syncope palpitation angina and has had no emergency room visits.  She is supervised by her family but does not weigh every day her sodium is restricted. Past Medical History:  Diagnosis Date  . Arthritis   . Atrial fibrillation ([place])    a. dx 05/2018, rate control strategy for now.  Marland Kitchen CAD in native artery    a. NSTEMI 05/2018 s/p DES to LAD with residual dz treated medically.  . Chronic bronchitis ([place])   . Chronic diastolic CHF (congestive heart failure) ([place])   . Diabetes mellitus without complication ([place])   . Frailty   . Gait instability   . GERD (gastroesophageal reflux disease)   . HLD (hyperlipidemia)   . HTN (hypertension)   . Hypertension   . Hypokalemia   . Insulin dependent diabetes mellitus   . Muscle pain   . Normocytic anemia   . Osteoarthritis   . PVC's (premature ventricular contractions)   . Swelling     Past Surgical History:  Procedure Laterality Date  . ABDOMINAL HYSTERECTOMY    . CARDIAC CATHETERIZATION    . CHOLECYSTECTOMY    . CORONARY ANGIOPLASTY    . CORONARY STENT INTERVENTION N/A 06/13/2018  Procedure: CORONARY STENT INTERVENTION;  Surgeon: Yvonne Kendall, MD;  [facility];  Service: Cardiovascular;  Laterality: N/A;  . LEFT HEART CATH AND CORONARY ANGIOGRAPHY N/A 06/13/2018   Procedure: LEFT HEART CATH AND CORONARY ANGIOGRAPHY;  Surgeon: Yvonne Kendall, MD;  Location: [facility];  Service: Cardiovascular;  Laterality: N/A;  . parithyroid surgery     09/2013  . TONSILLECTOMY      Current Medications: Current Meds  Medication Sig  . acetaminophen (TYLENOL) 500 MG tablet Take 500 mg by mouth  daily as needed.  . carvedilol (COREG) 25 MG tablet Take 25 mg by mouth 2 (two) times daily with a meal.  . cholecalciferol (VITAMIN D3) 25 MCG (1000 UT) tablet Take 1,000 Units by mouth daily.  . clopidogrel (PLAVIX) 75 MG tablet TAKE 1 TABLET BY MOUTH ONCE DAILY  . ELIQUIS 5 MG TABS tablet TAKE 1 TABLET BY MOUTH TWICE DAILY.  . fluticasone (FLONASE) 50 MCG/ACT nasal spray USE ONE SPRAY IN EACH NOSTRIL TWICE DAILY  . folic acid (FOLVITE) 800 MCG tablet Take 800 mcg by mouth daily.   Marland Kitchen glucose blood test strip 1 each by Other route as needed for other. Use as instructed  . insulin glargine (LANTUS) 100 UNIT/ML injection Inject 10 Units into the skin daily.   . Insulin Syringe-Needle U-100 (INSULIN SYRINGE 1CC/31GX5/16") 31G X 5/16" 1 ML MISC Use one daily for DM2 E11.9 on insulin  . Multiple Vitamins-Minerals (PRESERVISION AREDS PO) Take 1 capsule by mouth every morning.  . nitroGLYCERIN (NITROSTAT) 0.4 MG SL tablet Place 0.4 mg under the tongue every 5 (five) minutes as needed for chest pain.  Marland Kitchen Potassium Chloride ER 20 MEQ TBCR Take 20 mEq by mouth 2 (two) times daily.  Marland Kitchen Propylene Glycol (SYSTANE BALANCE OP) Apply 1 drop to eye 2 (two) times daily.  Marland Kitchen torsemide (DEMADEX) 20 MG tablet TAKE 2 TABLETS BY MOUTH TWICE DAILY.     Allergies:   Amlodipine besylate, Metformin, Celecoxib, Enalapril maleate, Estrogens, Fluvastatin, Furosemide, Hydrochlorothiazide, Irbesartan, Lisinopril, Losartan, Pantoprazole, Trandolapril, and Valsartan   Social History   Socioeconomic History  . Marital status: Widowed    Spouse name: Not on file  . Number of children: Not on file  . Years of education: Not on file  . Highest education level: Not on file  Occupational History  . Not on file  Social Needs  . Financial resource strain: Not on file  . Food insecurity    Worry: Not on file    Inability: Not on file  . Transportation needs    Medical: Not on file    Non-medical: Not on file  Tobacco Use   . Smoking status: Never Smoker  . Smokeless tobacco: Never Used  Substance and Sexual Activity  . Alcohol use: No  . Drug use: No  . Sexual activity: Not on file  Lifestyle  . Physical activity    Days per week: Not on file    Minutes per session: Not on file  . Stress: Not on file  Relationships  . Social Musician on phone: Not on file    Gets together: Not on file    Attends religious service: Not on file    Active member of club or organization: Not on file    Attends meetings of clubs or organizations: Not on file    Relationship status: Not on file  Other Topics Concern  . Not on file  Social History Narrative   **  Merged History Encounter **         Family History: The patient's family history includes Diabetes in her sister. ROS:   Please see the history of present illness.    All other systems reviewed and are negative.  EKGs/Labs/Other Studies Reviewed:    The following studies were reviewed today:  Recent Labs: 02/01/2019: Hemoglobin 13.3; NT-Pro BNP 1,404; Platelets 290 05/15/2019: BUN 14; Creatinine, Ser 0.88; Magnesium 2.3; Potassium 4.3; Sodium 140  Recent Lipid Panel No results found for: CHOL, TRIG, HDL, CHOLHDL, VLDL, LDLCALC, LDLDIRECT  Physical Exam:    VS:  BP 124/66   Pulse 74   Ht 5\' 2"  (1.575 m)   Wt 218 lb 6.4 oz (99.1 kg)   SpO2 97%   BMI 39.95 kg/m     Wt Readings from Last 3 Encounters:  06/28/19 218 lb 6.4 oz (99.1 kg)  05/15/19 219 lb (99.3 kg)  02/01/19 217 lb 6.4 oz (98.6 kg)     GEN: Elderly a little bit frail but was able to ambulate in the hallways to the exam room in no acute distress HEENT: Normal NECK: No JVD; No carotid bruits LYMPHATICS: No lymphadenopathy CARDIAC: Irregular S1 variable no murmur RESPIRATORY:  Clear to auscultation without rales, wheezing or rhonchi  ABDOMEN: Soft, non-tender, non-distended MUSCULOSKELETAL: 2-3+ bilateral pitting lower extremity edema; No deformity  SKIN: Warm and dry  NEUROLOGIC:  Alert and oriented x 3 PSYCHIATRIC:  Normal affect    Signed, Norman HerrlichBrian Lilliane Sposito, MD  06/28/2019 3:43 PM    [facility]

## 2019-06-28 ENCOUNTER — Other Ambulatory Visit: Payer: Self-pay

## 2019-06-28 ENCOUNTER — Encounter: Payer: Self-pay | Admitting: Cardiology

## 2019-06-28 ENCOUNTER — Ambulatory Visit (INDEPENDENT_AMBULATORY_CARE_PROVIDER_SITE_OTHER): Payer: Medicare Other | Admitting: Cardiology

## 2019-06-28 VITALS — BP 124/66 | HR 74 | Ht 62.0 in | Wt 218.4 lb

## 2019-06-28 DIAGNOSIS — I4821 Permanent atrial fibrillation: Secondary | ICD-10-CM | POA: Diagnosis not present

## 2019-06-28 DIAGNOSIS — I5032 Chronic diastolic (congestive) heart failure: Secondary | ICD-10-CM

## 2019-06-28 DIAGNOSIS — Z7901 Long term (current) use of anticoagulants: Secondary | ICD-10-CM

## 2019-06-28 DIAGNOSIS — I110 Hypertensive heart disease with heart failure: Secondary | ICD-10-CM | POA: Diagnosis not present

## 2019-06-28 DIAGNOSIS — I25110 Atherosclerotic heart disease of native coronary artery with unstable angina pectoris: Secondary | ICD-10-CM

## 2019-06-28 MED ORDER — TORSEMIDE 20 MG PO TABS: 40.0000 mg | ORAL_TABLET | Freq: Two times a day (BID) | ORAL | 1 refills | Status: DC

## 2019-06-28 NOTE — Addendum Note (Signed)
Addended by: Austin Miles on: 06/28/2019 03:52 PM   Modules accepted: Orders

## 2019-06-28 NOTE — Patient Instructions (Signed)
Medication Instructions:  Your physician has recommended you make the following change in your medication:   INCREASE torsemide (demadex) 20 mg: Take 2 tablets (40 mg) twice daily. Take 1 extra tablet (20 mg) in the afternoon every other day.  *If you need a refill on your cardiac medications before your next appointment, please call your pharmacy*  Lab Work: Your physician recommends that you return for lab work today: BMP, ProBNP, CBC.   If you have labs (blood work) drawn today and your tests are completely normal, you will receive your results only by: Marland Kitchen MyChart Message (if you have MyChart) OR . A paper copy in the mail If you have any lab test that is abnormal or we need to change your treatment, we will call you to review the results.  Testing/Procedures: None  Follow-Up: At [facility], you and your health needs are our priority.  As part of our continuing mission to provide you with exceptional heart care, we have created designated Provider Care Teams.  These Care Teams include your primary Cardiologist (physician) and Advanced Practice Providers (APPs -  Physician Assistants and Nurse Practitioners) who all work together to provide you with the care you need, when you need it.  Your next appointment:   3 month(s)  The format for your next appointment:   In Person  Provider:   Shirlee More, MD

## 2019-06-29 LAB — BASIC METABOLIC PANEL
BUN/Creatinine Ratio: 17 (ref 12–28)
BUN: 18 mg/dL (ref 8–27)
CO2: 25 mmol/L (ref 20–29)
Calcium: 9.2 mg/dL (ref 8.7–10.3)
Chloride: 99 mmol/L (ref 96–106)
Creatinine, Ser: 1.03 mg/dL — ABNORMAL HIGH (ref 0.57–1.00)
GFR calc Af Amer: 57 mL/min/{1.73_m2} — ABNORMAL LOW (ref >59)
GFR calc non Af Amer: 49 mL/min/{1.73_m2} — ABNORMAL LOW (ref >59)
Glucose: 135 mg/dL — ABNORMAL HIGH (ref 65–99)
Potassium: 3.9 mmol/L (ref 3.5–5.2)
Sodium: 140 mmol/L (ref 134–144)

## 2019-06-29 LAB — CBC
Hematocrit: 44.9 % (ref 34.0–46.6)
Hemoglobin: 14.1 g/dL (ref 11.1–15.9)
MCH: 28 pg (ref 26.6–33.0)
MCHC: 31.4 g/dL — ABNORMAL LOW (ref 31.5–35.7)
MCV: 89 fL (ref 79–97)
Platelets: 319 10*3/uL (ref 150–450)
RBC: 5.04 x10E6/uL (ref 3.77–5.28)
RDW: 13.6 % (ref 11.7–15.4)
WBC: 12.2 10*3/uL — ABNORMAL HIGH (ref 3.4–10.8)

## 2019-06-29 LAB — PRO B NATRIURETIC PEPTIDE: NT-Pro BNP: 1357 pg/mL — ABNORMAL HIGH (ref 0–738)

## 2019-07-07 ENCOUNTER — Other Ambulatory Visit: Payer: Self-pay | Admitting: Cardiology

## 2019-07-25 ENCOUNTER — Telehealth: Payer: Self-pay | Admitting: Cardiology

## 2019-07-25 MED ORDER — ASPIRIN EC 81 MG PO TBEC: 81.0000 mg | DELAYED_RELEASE_TABLET | Freq: Every day | ORAL | 3 refills | Status: DC

## 2019-07-25 NOTE — Telephone Encounter (Signed)
DPR on record to speak with pt daughter.  Caller reports that she discovered today that pt stopped taking her Plavix 3 days ago, per caller pt indicates that the plavix was causing her to itch so she stopped taking it.  Caller unaware if pt continues to have issue with itching since not taking medication but does agree that pt has been on medication for over a year and this is first indication that pt has of it causing her to itch.  Explained to caller importance of Plavix following Heart Cath showing CAD and need for aggressive secondary prevention.  Pt currently prescribed  Eliquis and Plavix.  Pt daughter will discuss with pt the importance of being on Plavix, she is wondering if there is anything else that pt can take in place of Plavix if she is unable to convince pt to restart Plavix?

## 2019-07-25 NOTE — Telephone Encounter (Signed)
Spoke with pt daughter Angelique Blonder.  Advised of MD instructions that pt can stop Plavix and restart ASA 81mg  daily.  She v/u of medication changes and will update pt.  Confirmed that pt should continue Eliquis as ordered.

## 2019-07-25 NOTE — Telephone Encounter (Signed)
She is now more than 1 year remote from her PCI we can discontinue the Plavix and have her take aspirin 81 mg daily plus her Eliquis.  She needs to take both.

## 2019-07-25 NOTE — Telephone Encounter (Signed)
Pt has not been taking Plavix for 3 days

## 2019-09-16 ENCOUNTER — Other Ambulatory Visit: Payer: Self-pay | Admitting: Cardiology

## 2019-09-25 ENCOUNTER — Other Ambulatory Visit: Payer: Self-pay | Admitting: Cardiology

## 2019-09-25 ENCOUNTER — Telehealth: Payer: Self-pay | Admitting: Cardiology

## 2019-09-25 MED ORDER — POTASSIUM CHLORIDE ER 20 MEQ PO TBCR: 20.0000 meq | EXTENDED_RELEASE_TABLET | Freq: Two times a day (BID) | ORAL | 1 refills | Status: DC

## 2019-09-25 NOTE — Telephone Encounter (Signed)
New Message   *STAT* If patient is at the pharmacy, call can be transferred to refill team.   1. Which medications need to be refilled? (please list name of each medication and dose if known) Potassium Chloride ER 20 MEQ TBCR   2. Which pharmacy/location (including street and city if local pharmacy) is medication to be sent to?   3. Do they need a 30 day or 90 day supply? 90 day

## 2019-10-02 NOTE — Progress Notes (Signed)
Cardiology Office Note:    Date:  10/03/2019   ID:  Donna Ballard, DOB February 08, 1931, MRN [id number]  PCP:  Donna Berger, MD  Cardiologist:  Shirlee More, MD    Referring MD: Donna Berger, MD    ASSESSMENT:    1. Chronic diastolic heart failure ([place])   2. Hypertensive heart disease with chronic diastolic congestive heart failure (HCC)   3. Permanent atrial fibrillation ([place])   4. Chronic anticoagulation   5. Coronary artery disease involving native coronary artery of native heart with unstable angina pectoris ([place])   6. Nonrheumatic aortic valve stenosis   7. Hyperlipidemia, unspecified hyperlipidemia type    PLAN:    In order of problems listed above:  1. Perspective she is doing well her heart failure is compensated she will continue her current loop diuretic and potassium recheck renal function. 2. Pressure target continue current antihypertensives including beta-blocker 3. Is controlled continue beta-blocker along with her anticoagulant and low-dose aspirin. 4. Stable CAD encouraged her she has chest discomfort to take nitroglycerin.  At this time I do not think I need to intensify her antianginal treatment 5. Stable mild aortic stenosis 6. With a controlled dyslipidemia she refuses to accept lipid-lowering therapy and has been intolerant of statin   Next appointment: 3 months   Medication Adjustments/Labs and Tests Ordered: Current medicines are reviewed at length with the patient today.  Concerns regarding medicines are outlined above.  No orders of the defined types were placed in this encounter.  No orders of the defined types were placed in this encounter.   Chief Complaint  Patient presents with  . Follow-up  . Congestive Heart Failure    History of Present Illness:    Donna Ballard is a 86 y.o. female with a hx of CAD NSTEMI with PCI and stent to LAD 75/6433, diastolic heart failure, atrial fibrillation, HTN, HLD, Mild aortic valve stenosis, mild mitral  regurgitation  last seen 06/28/2019 with decompensated heart failure. Compliance with diet, lifestyle and medications: Yes  From a cardiology perspective she is doing well her weights are stable she is going no edema she has rare episodes of angina has not needed nitroglycerin and is not short of breath.  She is very limited her ambulation has a great deal trouble with disequilibrium and complains of bitterly of visual loss thinks is due to medications but I do not.  Her blood sugars are running up to 170% and I encouraged her to get back and see her PCP.  We will recheck labs today including a proBNP renal function and CBC.  She refuses lipid-lowering therapy. Past Medical History:  Diagnosis Date  . Acute respiratory failure with hypoxia ([place]) 06/15/2018  . Aortic stenosis 07/06/2018  . Arthritis   . Atrial enlargement, bilateral 07/06/2018  . Atrial fibrillation ([place])    a. dx 05/2018, rate control strategy for now.  . Body mass index (BMI) of 40.0-44.9 in adult ([place]) 11/04/2015  . CAD in native artery    a. NSTEMI 05/2018 s/p DES to LAD with residual dz treated medically.  . Chronic anticoagulation 07/06/2018  . Chronic bronchitis ([place])   . Chronic diastolic CHF (congestive heart failure) ([place])   . Chronic diastolic heart failure ([place]) 06/15/2018  . Coronary artery disease involving native coronary artery of native heart with unstable angina pectoris ([place]) 06/15/2018  . Deep venous insufficiency 07/22/2018  . Diabetes mellitus with polyneuropathy ([place]) 07/22/2018  . Diabetes mellitus without complication ([place])   . Esophageal  reflux 07/22/2018  . Essential hypertension 03/20/2015  . Frailty   . Gait instability   . Gastritis medicamentosa 04/29/2018  . GERD (gastroesophageal reflux disease)   . HLD (hyperlipidemia)   . HTN (hypertension)   . Hyperlipidemia 06/15/2018  . Hypertension   . Hypertensive heart disease 06/15/2018  . Hypokalemia   . Insulin dependent diabetes mellitus   .  Leukocytosis 06/15/2018  . Mitral regurgitation 07/06/2018  . Morbid obesity (HCC) 06/15/2018  . Muscle pain   . Neuropathy due to medical condition (HCC) 07/22/2018  . Normocytic anemia   . NSTEMI (non-ST elevated myocardial infarction) (HCC) 06/12/2018  . Osteoarthritis   . Osteopenia 07/22/2018  . Persistent atrial fibrillation (HCC) 06/15/2018  . Pure hypercholesterolemia 07/22/2018  . PVC's (premature ventricular contractions)   . Swelling   . Urge incontinence 07/22/2018  . Urinary incontinence 07/22/2018  . Visual impairment 07/22/2018  . Vitamin D deficiency 07/22/2018    Past Surgical History:  Procedure Laterality Date  . ABDOMINAL HYSTERECTOMY    . CARDIAC CATHETERIZATION    . CHOLECYSTECTOMY    . CORONARY ANGIOPLASTY    . CORONARY STENT INTERVENTION N/A 06/13/2018   Procedure: CORONARY STENT INTERVENTION;  Surgeon: Yvonne Kendall, MD;  [facility];  Service: Cardiovascular;  Laterality: N/A;  . LEFT HEART CATH AND CORONARY ANGIOGRAPHY N/A 06/13/2018   Procedure: LEFT HEART CATH AND CORONARY ANGIOGRAPHY;  Surgeon: Yvonne Kendall, MD;  Location: [facility];  Service: Cardiovascular;  Laterality: N/A;  . parithyroid surgery     09/2013  . TONSILLECTOMY      Current Medications: Current Meds  Medication Sig  . acetaminophen (TYLENOL) 500 MG tablet Take 500 mg by mouth daily as needed.  Marland Kitchen aspirin EC 81 MG tablet Take 1 tablet (81 mg total) by mouth daily.  . carvedilol (COREG) 25 MG tablet Take 25 mg by mouth 2 (two) times daily with a meal.  . cholecalciferol (VITAMIN D3) 25 MCG (1000 UT) tablet Take 1,000 Units by mouth daily.  Marland Kitchen ELIQUIS 5 MG TABS tablet TAKE 1 TABLET BY MOUTH TWICE DAILY.  . fluticasone (FLONASE) 50 MCG/ACT nasal spray USE ONE SPRAY IN EACH NOSTRIL TWICE DAILY  . folic acid (FOLVITE) 800 MCG tablet Take 800 mcg by mouth daily.   Marland Kitchen glucose blood test strip 1 each by Other route as needed for other. Use as instructed  . insulin  glargine (LANTUS) 100 UNIT/ML injection Inject 10 Units into the skin daily.   . Insulin Syringe-Needle U-100 (INSULIN SYRINGE 1CC/31GX5/16") 31G X 5/16" 1 ML MISC Use one daily for DM2 E11.9 on insulin  . Multiple Vitamins-Minerals (PRESERVISION AREDS PO) Take 1 capsule by mouth every morning.  . nitroGLYCERIN (NITROSTAT) 0.4 MG SL tablet Place 0.4 mg under the tongue every 5 (five) minutes as needed for chest pain.  Marland Kitchen Potassium Chloride ER 20 MEQ TBCR Take 20 mEq by mouth 2 (two) times daily.  Marland Kitchen Propylene Glycol (SYSTANE BALANCE OP) Apply 1 drop to eye 2 (two) times daily.  Marland Kitchen torsemide (DEMADEX) 20 MG tablet Take 2 tablets (40 mg total) by mouth 2 (two) times daily. Take 1 extra tablet in the afternoon every other day.     Allergies:   Amlodipine besylate, Metformin, Celecoxib, Enalapril maleate, Estrogens, Fluvastatin, Furosemide, Hydrochlorothiazide, Irbesartan, Lisinopril, Losartan, Pantoprazole, Trandolapril, and Valsartan   Social History   Socioeconomic History  . Marital status: Widowed    Spouse name: Not on file  . Number of children: Not  on file  . Years of education: Not on file  . Highest education level: Not on file  Occupational History  . Not on file  Tobacco Use  . Smoking status: Never Smoker  . Smokeless tobacco: Never Used  Substance and Sexual Activity  . Alcohol use: No  . Drug use: No  . Sexual activity: Not on file  Other Topics Concern  . Not on file  Social History Narrative   ** Merged History Encounter **       Social Determinants of Health   Financial Resource Strain:   . Difficulty of Paying Living Expenses:   Food Insecurity:   . Worried About Programme researcher, broadcasting/film/video in the Last Year:   . Barista in the Last Year:   Transportation Needs:   . Freight forwarder (Medical):   Marland Kitchen Lack of Transportation (Non-Medical):   Physical Activity:   . Days of Exercise per Week:   . Minutes of Exercise per Session:   Stress:   . Feeling of  Stress :   Social Connections:   . Frequency of Communication with Friends and Family:   . Frequency of Social Gatherings with Friends and Family:   . Attends Religious Services:   . Active Member of Clubs or Organizations:   . Attends Banker Meetings:   Marland Kitchen Marital Status:      Family History: The patient's family history includes Diabetes in her sister. ROS:   Please see the history of present illness.    All other systems reviewed and are negative.  EKGs/Labs/Other Studies Reviewed:    The following studies were reviewed today:   Recent Labs: 05/15/2019: Magnesium 2.3 06/28/2019: BUN 18; Creatinine, Ser 1.03; Hemoglobin 14.1; NT-Pro BNP 1,357; Platelets 319; Potassium 3.9; Sodium 140  Recent Lipid Panel No results found for: CHOL, TRIG, HDL, CHOLHDL, VLDL, LDLCALC, LDLDIRECT  Physical Exam:    VS:  BP 128/76   Pulse 66   Temp 97.7 F (36.5 C)   Ht 5\' 2"  (1.575 m)   Wt 218 lb 12.8 oz (99.2 kg)   SpO2 96%   BMI 40.02 kg/m     Wt Readings from Last 3 Encounters:  10/03/19 218 lb 12.8 oz (99.2 kg)  06/28/19 218 lb 6.4 oz (99.1 kg)  05/15/19 219 lb (99.3 kg)     GEN:  Well nourished, well developed in no acute distress HEENT: Normal NECK: No JVD; No carotid bruits LYMPHATICS: No lymphadenopathy CARDIAC: S1 variableno murmurs, rubs, gallops RESPIRATORY:  Clear to auscultation without rales, wheezing or rhonchi  ABDOMEN: Soft, non-tender, non-distended MUSCULOSKELETAL: Plus bilateral pitting lower extremity edema; No deformity  SKIN: Warm and dry NEUROLOGIC:  Alert and oriented x 3 PSYCHIATRIC:  Normal affect    Signed, 05/17/19, MD  10/03/2019 4:00 PM    [facility]

## 2019-10-03 ENCOUNTER — Other Ambulatory Visit: Payer: Self-pay

## 2019-10-03 ENCOUNTER — Encounter: Payer: Self-pay | Admitting: Cardiology

## 2019-10-03 ENCOUNTER — Ambulatory Visit (INDEPENDENT_AMBULATORY_CARE_PROVIDER_SITE_OTHER): Payer: Medicare Other | Admitting: Cardiology

## 2019-10-03 VITALS — BP 128/76 | HR 66 | Temp 97.7°F | Ht 62.0 in | Wt 218.8 lb

## 2019-10-03 DIAGNOSIS — I110 Hypertensive heart disease with heart failure: Secondary | ICD-10-CM

## 2019-10-03 DIAGNOSIS — I4821 Permanent atrial fibrillation: Secondary | ICD-10-CM | POA: Diagnosis not present

## 2019-10-03 DIAGNOSIS — I5032 Chronic diastolic (congestive) heart failure: Secondary | ICD-10-CM

## 2019-10-03 DIAGNOSIS — Z7901 Long term (current) use of anticoagulants: Secondary | ICD-10-CM

## 2019-10-03 DIAGNOSIS — R739 Hyperglycemia, unspecified: Secondary | ICD-10-CM

## 2019-10-03 DIAGNOSIS — I350 Nonrheumatic aortic (valve) stenosis: Secondary | ICD-10-CM

## 2019-10-03 DIAGNOSIS — I25110 Atherosclerotic heart disease of native coronary artery with unstable angina pectoris: Secondary | ICD-10-CM

## 2019-10-03 DIAGNOSIS — E785 Hyperlipidemia, unspecified: Secondary | ICD-10-CM

## 2019-10-03 NOTE — Patient Instructions (Addendum)
Medication Instructions:   Your physician recommends that you continue on your current medications as directed. Please refer to the Current Medication list given to you today.   *If you need a refill on your cardiac medications before your next appointment, please call your pharmacy*   Lab Work:  BMET PRO BNP CBC AND HGB AIC   If you have labs (blood work) drawn today and your tests are completely normal, you will receive your results only by: Marland Kitchen MyChart Message (if you have MyChart) OR . A paper copy in the mail If you have any lab test that is abnormal or we need to change your treatment, we will call you to review the results.   Testing/Procedures: NONE ORDERED  TODAY   Follow-Up: At [facility], you and your health needs are our priority.  As part of our continuing mission to provide you with exceptional heart care, we have created designated Provider Care Teams.  These Care Teams include your primary Cardiologist (physician) and Advanced Practice Providers (APPs -  Physician Assistants and Nurse Practitioners) who all work together to provide you with the care you need, when you need it.  We recommend signing up for the patient portal called "MyChart".  Sign up information is provided on this After Visit Summary.  MyChart is used to connect with patients for Virtual Visits (Telemedicine).  Patients are able to view lab/test results, encounter notes, upcoming appointments, etc.  Non-urgent messages can be sent to your provider as well.   To learn more about what you can do with MyChart, go to [email or web address].    Your next appointment:   3 month(s)  The format for your next appointment:   In Person  Provider:   You will see Norman Herrlich, MD.  Or, you can be scheduled with the following Advanced Practice Provider on your designated Care Team (at our [facility]):  Gillian Shields, FNP    Other Instructions  FOLLOW UP WITH PRIMARY CARE ABOUT DIABETES

## 2019-10-04 ENCOUNTER — Telehealth: Payer: Self-pay

## 2019-10-04 LAB — CBC
Hematocrit: 42.8 % (ref 34.0–46.6)
Hemoglobin: 13.8 g/dL (ref 11.1–15.9)
MCH: 27.6 pg (ref 26.6–33.0)
MCHC: 32.2 g/dL (ref 31.5–35.7)
MCV: 86 fL (ref 79–97)
Platelets: 324 10*3/uL (ref 150–450)
RBC: 5 x10E6/uL (ref 3.77–5.28)
RDW: 13.4 % (ref 11.7–15.4)
WBC: 12 10*3/uL — ABNORMAL HIGH (ref 3.4–10.8)

## 2019-10-04 LAB — BASIC METABOLIC PANEL
BUN/Creatinine Ratio: 19 (ref 12–28)
BUN: 17 mg/dL (ref 8–27)
CO2: 24 mmol/L (ref 20–29)
Calcium: 9.3 mg/dL (ref 8.7–10.3)
Chloride: 100 mmol/L (ref 96–106)
Creatinine, Ser: 0.88 mg/dL (ref 0.57–1.00)
GFR calc Af Amer: 68 mL/min/{1.73_m2} (ref >59)
GFR calc non Af Amer: 59 mL/min/{1.73_m2} — ABNORMAL LOW (ref >59)
Glucose: 177 mg/dL — ABNORMAL HIGH (ref 65–99)
Potassium: 4.1 mmol/L (ref 3.5–5.2)
Sodium: 141 mmol/L (ref 134–144)

## 2019-10-04 LAB — HEMOGLOBIN A1C
Est. average glucose Bld gHb Est-mCnc: 174 mg/dL
Hgb A1c MFr Bld: 7.7 % — ABNORMAL HIGH (ref 4.8–5.6)

## 2019-10-04 LAB — PRO B NATRIURETIC PEPTIDE: NT-Pro BNP: 1579 pg/mL — ABNORMAL HIGH (ref 0–738)

## 2019-10-04 NOTE — Telephone Encounter (Signed)
-----   Message from Baldo Daub, MD sent at 10/04/2019 10:36 AM EDT ----- Normal or stable result  Good result except your diabetes uncontrolled may well be the reason she has trouble with her vision and she needs to see her primary care physician.  No change in medications.

## 2019-10-04 NOTE — Telephone Encounter (Signed)
LMTCB regarding lab results.  

## 2019-10-13 DIAGNOSIS — Z5320 Procedure and treatment not carried out because of patient's decision for unspecified reasons: Secondary | ICD-10-CM

## 2019-10-13 DIAGNOSIS — Z5329 Procedure and treatment not carried out because of patient's decision for other reasons: Secondary | ICD-10-CM

## 2019-10-13 DIAGNOSIS — R1310 Dysphagia, unspecified: Secondary | ICD-10-CM

## 2019-10-13 HISTORY — DX: Dysphagia, unspecified: R13.10

## 2019-10-13 HISTORY — DX: Procedure and treatment not carried out because of patient's decision for unspecified reasons: Z53.20

## 2019-10-13 HISTORY — DX: Procedure and treatment not carried out because of patient's decision for other reasons: Z53.29

## 2019-11-16 ENCOUNTER — Encounter: Payer: Self-pay | Admitting: Cardiology

## 2019-12-17 DIAGNOSIS — M5441 Lumbago with sciatica, right side: Secondary | ICD-10-CM

## 2019-12-17 HISTORY — DX: Lumbago with sciatica, right side: M54.41

## 2020-01-06 ENCOUNTER — Other Ambulatory Visit: Payer: Self-pay | Admitting: Cardiology

## 2020-01-08 ENCOUNTER — Ambulatory Visit: Payer: Medicare Other | Admitting: Cardiology

## 2020-01-09 ENCOUNTER — Ambulatory Visit: Payer: Medicare Other | Admitting: Cardiology

## 2020-01-11 ENCOUNTER — Encounter: Payer: Self-pay | Admitting: Cardiology

## 2020-01-11 ENCOUNTER — Ambulatory Visit (INDEPENDENT_AMBULATORY_CARE_PROVIDER_SITE_OTHER): Payer: Medicare Other | Admitting: Cardiology

## 2020-01-11 ENCOUNTER — Other Ambulatory Visit: Payer: Self-pay

## 2020-01-11 VITALS — BP 170/80 | HR 60 | Ht 62.0 in | Wt 218.0 lb

## 2020-01-11 DIAGNOSIS — I110 Hypertensive heart disease with heart failure: Secondary | ICD-10-CM

## 2020-01-11 DIAGNOSIS — I5032 Chronic diastolic (congestive) heart failure: Secondary | ICD-10-CM | POA: Diagnosis not present

## 2020-01-11 DIAGNOSIS — I4821 Permanent atrial fibrillation: Secondary | ICD-10-CM

## 2020-01-11 DIAGNOSIS — I350 Nonrheumatic aortic (valve) stenosis: Secondary | ICD-10-CM

## 2020-01-11 DIAGNOSIS — Z7901 Long term (current) use of anticoagulants: Secondary | ICD-10-CM

## 2020-01-11 MED ORDER — METOLAZONE 2.5 MG PO TABS: 2.5000 mg | ORAL_TABLET | Freq: Every day | ORAL | 3 refills | Status: DC

## 2020-01-11 NOTE — Patient Instructions (Signed)
Medication Instructions:  Your physician has recommended you make the following change in your medication:  START: Metolazone 2.5 mg take one tablet by mouth daily 30 minutes before taking your torsemide in the morning.   If possible please take the first dose of the metolazone prior to your torsemide tonight.   Please take 40 mg per two tablets of your torsemide twice daily.  *If you need a refill on your cardiac medications before your next appointment, please call your pharmacy*   Lab Work: None If you have labs (blood work) drawn today and your tests are completely normal, you will receive your results only by: Marland Kitchen MyChart Message (if you have MyChart) OR . A paper copy in the mail If you have any lab test that is abnormal or we need to change your treatment, we will call you to review the results.   Testing/Procedures: Your physician has requested that you have an echocardiogram. Echocardiography is a painless test that uses sound waves to create images of your heart. It provides your doctor with information about the size and shape of your heart and how well your heart's chambers and valves are working. This procedure takes approximately one hour. There are no restrictions for this procedure.     Follow-Up: At [facility], you and your health needs are our priority.  As part of our continuing mission to provide you with exceptional heart care, we have created designated Provider Care Teams.  These Care Teams include your primary Cardiologist (physician) and Advanced Practice Providers (APPs -  Physician Assistants and Nurse Practitioners) who all work together to provide you with the care you need, when you need it.  We recommend signing up for the patient portal called "MyChart".  Sign up information is provided on this After Visit Summary.  MyChart is used to connect with patients for Virtual Visits (Telemedicine).  Patients are able to view lab/test results, encounter notes,  upcoming appointments, etc.  Non-urgent messages can be sent to your provider as well.   To learn more about what you can do with MyChart, go to [email or web address].    Your next appointment:   1 week(s)  The format for your next appointment:   In Person  Provider:   Norman Herrlich, MD   Other Instructions

## 2020-01-11 NOTE — Progress Notes (Signed)
Cardiology Office Note:    Date:  01/11/2020   ID:  Donna Ballard, DOB 06-Aug-1930, MRN [id number]  PCP:  Maris Berger, MD  Cardiologist:  Shirlee More, MD    Referring MD: Maris Berger, MD    ASSESSMENT:    1. Hypertensive heart disease with chronic diastolic congestive heart failure ([place])   2. Chronic diastolic heart failure (HCC)   3. Permanent atrial fibrillation ([place])   4. Chronic anticoagulation   5. Nonrheumatic aortic valve stenosis    PLAN:    In order of problems listed above:  1. She presents today with decompensated heart failure fluid overload I will increase her loop diuretic of every 2 days of metolazone if unimproved will require hospitalization and IV diuretic.  Unfortunately my schedule next week recheck labs and echocardiogram is ordered although I doubt that her aortic stenosis has progressed quickly.  Family is comfortable with this plan her daughter is present participates in the evaluation and decision-making 2. Stable atrial fibrillation is rate controlled continue beta-blocker anticoagulant 3. Recheck echocardiogram 4. Blood pressure is elevated should respond to diuresis with family continue to monitor record at home   Next appointment: 1 week   Medication Adjustments/Labs and Tests Ordered: Current medicines are reviewed at length with the patient today.  Concerns regarding medicines are outlined above.  No orders of the defined types were placed in this encounter.  No orders of the defined types were placed in this encounter.   Chief Complaint  Patient presents with  . Follow-up    History of Present Illness:    Donna Ballard is a 86 y.o. female with a hx of CAD NSTEMI with PCI and stent to LAD 41/3244, diastolic heart failure, atrial fibrillation, HTN, HLD, Mild aortic valve stenosis, mild mitral regurgitation  last seen 09/23/2019. Echocardiogram performed 06/14/2018 showed mild aortic stenosis mean gradient 11 mmHg. Compliance with diet,  lifestyle and medications: Yes  She is not doing well history of fall she is complaining of back and hip pain was sent to physical therapy and they declined doing it because her blood pressure is elevated.  She has had increasing shortness of breath and wheezing and she is having orthopnea sitting up at nighttime her weight is up 5 pounds or greater.  Her family supervises her she is compliant with her medications but clearly is in decompensated heart failure with edema and neck vein distention.  Our plan is give her metolazone the next 2 days if she fails to respond she will require hospitalization and IV supervised diuretic.  I will see her back in the office next week we will force her into my schedule recheck her renal function proBNP at that time and she needs a follow-up echocardiogram regarding EF and her aortic stenosis I doubt she is progressed in such a short period of time initially the family said she is having chest pain when I speak to the patient shortness of breath.  I am not doing an EKG today as she short of breath at rest I think would be extremely difficult with her obesity and her back and hip pain to posture and do a diagnostic EKG  Recent labs performed 12/14/2019 [facility] primary care: CMP potassium 4.1 creatinine 0.99 GFR 51 cc Hemoglobin A1c 7.0% Past Medical History:  Diagnosis Date  . Acute respiratory failure with hypoxia ([place]) 06/15/2018  . Aortic stenosis 07/06/2018  . Arthritis   . Atrial enlargement, bilateral 07/06/2018  . Atrial fibrillation ([place])  a. dx 05/2018, rate control strategy for now.  . Body mass index (BMI) of 40.0-44.9 in adult (HCC) 11/04/2015  . CAD in native artery    a. NSTEMI 05/2018 s/p DES to LAD with residual dz treated medically.  . Chronic anticoagulation 07/06/2018  . Chronic bronchitis (HCC)   . Chronic diastolic CHF (congestive heart failure) (HCC)   . Chronic diastolic heart failure (HCC) 06/15/2018  . Coronary artery  disease involving native coronary artery of native heart with unstable angina pectoris (HCC) 06/15/2018  . Deep venous insufficiency 07/22/2018  . Diabetes mellitus with polyneuropathy (HCC) 07/22/2018  . Diabetes mellitus without complication (HCC)   . Esophageal reflux 07/22/2018  . Essential hypertension 03/20/2015  . Frailty   . Gait instability   . Gastritis medicamentosa 04/29/2018  . GERD (gastroesophageal reflux disease)   . HLD (hyperlipidemia)   . HTN (hypertension)   . Hyperlipidemia 06/15/2018  . Hypertension   . Hypertensive heart disease 06/15/2018  . Hypokalemia   . Insulin dependent diabetes mellitus   . Leukocytosis 06/15/2018  . Mitral regurgitation 07/06/2018  . Morbid obesity (HCC) 06/15/2018  . Muscle pain   . Neuropathy due to medical condition (HCC) 07/22/2018  . Normocytic anemia   . NSTEMI (non-ST elevated myocardial infarction) (HCC) 06/12/2018  . Osteoarthritis   . Osteopenia 07/22/2018  . Persistent atrial fibrillation (HCC) 06/15/2018  . Pure hypercholesterolemia 07/22/2018  . PVC's (premature ventricular contractions)   . Swelling   . Urge incontinence 07/22/2018  . Urinary incontinence 07/22/2018  . Visual impairment 07/22/2018  . Vitamin D deficiency 07/22/2018    Past Surgical History:  Procedure Laterality Date  . ABDOMINAL HYSTERECTOMY    . CARDIAC CATHETERIZATION    . CHOLECYSTECTOMY    . CORONARY ANGIOPLASTY    . CORONARY STENT INTERVENTION N/A 06/13/2018   Procedure: CORONARY STENT INTERVENTION;  Surgeon: Yvonne Kendall, MD;  Location: [facility];  Service: Cardiovascular;  Laterality: N/A;  . LEFT HEART CATH AND CORONARY ANGIOGRAPHY N/A 06/13/2018   Procedure: LEFT HEART CATH AND CORONARY ANGIOGRAPHY;  Surgeon: Yvonne Kendall, MD;  Location: [facility] INVASIVE CV LAB;  Service: Cardiovascular;  Laterality: N/A;  . parithyroid surgery     09/2013  . TONSILLECTOMY      Current Medications: Current Meds  Medication Sig  . acetaminophen  (TYLENOL) 500 MG tablet Take 500 mg by mouth daily as needed.  Marland Kitchen aspirin EC 81 MG tablet Take 1 tablet (81 mg total) by mouth daily.  . carvedilol (COREG) 25 MG tablet Take 25 mg by mouth 2 (two) times daily with a meal.  . cholecalciferol (VITAMIN D3) 25 MCG (1000 UT) tablet Take 1,000 Units by mouth daily.  Marland Kitchen ELIQUIS 5 MG TABS tablet TAKE 1 TABLET BY MOUTH TWICE DAILY.  Marland Kitchen esomeprazole (NEXIUM) 40 MG capsule Take 40 mg by mouth daily at 12 noon.  . fluticasone (FLONASE) 50 MCG/ACT nasal spray USE ONE SPRAY IN EACH NOSTRIL TWICE DAILY  . folic acid (FOLVITE) 800 MCG tablet Take 800 mcg by mouth daily.   Marland Kitchen glucose blood test strip 1 each by Other route as needed for other. Use as instructed  . insulin glargine (LANTUS) 100 UNIT/ML injection Inject 10 Units into the skin daily.   . Insulin Syringe-Needle U-100 (INSULIN SYRINGE 1CC/31GX5/16") 31G X 5/16" 1 ML MISC Use one daily for DM2 E11.9 on insulin  . Multiple Vitamins-Minerals (PRESERVISION AREDS PO) Take 1 capsule by mouth every morning.  . nitroGLYCERIN (NITROSTAT) 0.4 MG SL  tablet Place 0.4 mg under the tongue every 5 (five) minutes as needed for chest pain.  Marland Kitchen Potassium Chloride ER 20 MEQ TBCR Take 20 mEq by mouth 2 (two) times daily.  Marland Kitchen Propylene Glycol (SYSTANE BALANCE OP) Apply 1 drop to eye 2 (two) times daily.  Marland Kitchen torsemide (DEMADEX) 20 MG tablet TAKE TWO TABLETS BY MOUTH TWICE DAILY *TAKE 1 EXTRA TABLET IN THE AFTERNOON EVERY OTHER DAY*     Allergies:   Amlodipine besylate, Metformin, Celecoxib, Enalapril maleate, Estrogens, Fluvastatin, Furosemide, Hydrochlorothiazide, Irbesartan, Lisinopril, Losartan, Pantoprazole, Trandolapril, and Valsartan   Social History   Socioeconomic History  . Marital status: Widowed    Spouse name: Not on file  . Number of children: Not on file  . Years of education: Not on file  . Highest education level: Not on file  Occupational History  . Not on file  Tobacco Use  . Smoking status: Never  Smoker  . Smokeless tobacco: Never Used  Vaping Use  . Vaping Use: Never used  Substance and Sexual Activity  . Alcohol use: No  . Drug use: No  . Sexual activity: Not on file  Other Topics Concern  . Not on file  Social History Narrative   ** Merged History Encounter **       Social Determinants of Health   Financial Resource Strain:   . Difficulty of Paying Living Expenses:   Food Insecurity:   . Worried About Programme researcher, broadcasting/film/video in the Last Year:   . Barista in the Last Year:   Transportation Needs:   . Freight forwarder (Medical):   Marland Kitchen Lack of Transportation (Non-Medical):   Physical Activity:   . Days of Exercise per Week:   . Minutes of Exercise per Session:   Stress:   . Feeling of Stress :   Social Connections:   . Frequency of Communication with Friends and Family:   . Frequency of Social Gatherings with Friends and Family:   . Attends Religious Services:   . Active Member of Clubs or Organizations:   . Attends Banker Meetings:   Marland Kitchen Marital Status:      Family History: The patient's family history includes Diabetes in her sister. ROS:   Please see the history of present illness.    All other systems reviewed and are negative.  EKGs/Labs/Other Studies Reviewed:    The following studies were reviewed today:    Recent Labs: 05/15/2019: Magnesium 2.3 10/03/2019: BUN 17; Creatinine, Ser 0.88; Hemoglobin 13.8; NT-Pro BNP 1,579; Platelets 324; Potassium 4.1; Sodium 141  Recent Lipid Panel No results found for: CHOL, TRIG, HDL, CHOLHDL, VLDL, LDLCALC, LDLDIRECT  Physical Exam:    VS:  BP (!) 170/80 (BP Location: Left Arm, Patient Position: Sitting, Cuff Size: Large)   Pulse 60   Ht 5\' 2"  (1.575 m)   Wt 218 lb (98.9 kg)   SpO2 95%   BMI 39.87 kg/m     Wt Readings from Last 3 Encounters:  01/11/20 218 lb (98.9 kg)  10/03/19 218 lb 12.8 oz (99.2 kg)  06/28/19 218 lb 6.4 oz (99.1 kg)     GEN: She is breathless well  nourished, well developed in no acute distress HEENT: Normal NECK: Moderate JVD; No carotid bruits LYMPHATICS: No lymphadenopathy CARDIAC: Irregular S1 variable localized murmur 2/6 midsystolic aortic murmur, rubs, gallops RESPIRATORY:  Clear to auscultation without rales, wheezing or rhonchi  ABDOMEN: Soft, non-tender, non-distended MUSCULOSKELETAL: 3+ bilateral lower extremity pitting edema;  No deformity  SKIN: Warm and dry NEUROLOGIC:  Alert and oriented x 3 PSYCHIATRIC:  Normal affect    Signed, Norman Herrlich, MD  01/11/2020 3:36 PM    [facility]

## 2020-01-19 ENCOUNTER — Other Ambulatory Visit: Payer: Self-pay

## 2020-01-19 ENCOUNTER — Ambulatory Visit (INDEPENDENT_AMBULATORY_CARE_PROVIDER_SITE_OTHER): Payer: Medicare Other | Admitting: Cardiology

## 2020-01-19 ENCOUNTER — Ambulatory Visit: Payer: Medicare Other | Admitting: Cardiology

## 2020-01-19 ENCOUNTER — Encounter: Payer: Self-pay | Admitting: Cardiology

## 2020-01-19 VITALS — BP 150/66 | HR 64 | Ht 62.0 in | Wt 211.2 lb

## 2020-01-19 DIAGNOSIS — I110 Hypertensive heart disease with heart failure: Secondary | ICD-10-CM

## 2020-01-19 DIAGNOSIS — Z7901 Long term (current) use of anticoagulants: Secondary | ICD-10-CM

## 2020-01-19 DIAGNOSIS — I350 Nonrheumatic aortic (valve) stenosis: Secondary | ICD-10-CM

## 2020-01-19 DIAGNOSIS — R0602 Shortness of breath: Secondary | ICD-10-CM

## 2020-01-19 DIAGNOSIS — R110 Nausea: Secondary | ICD-10-CM

## 2020-01-19 DIAGNOSIS — I4821 Permanent atrial fibrillation: Secondary | ICD-10-CM | POA: Diagnosis not present

## 2020-01-19 DIAGNOSIS — I5032 Chronic diastolic (congestive) heart failure: Secondary | ICD-10-CM

## 2020-01-19 MED ORDER — METOLAZONE 2.5 MG PO TABS: ORAL_TABLET | ORAL | 3 refills | Status: DC

## 2020-01-19 NOTE — Progress Notes (Signed)
Cardiology Office Note:    Date:  01/19/2020   ID:  [name], DOB 03-Jun-1931, MRN [id number]  PCP:  Everlean Cherry, MD  Cardiologist:  Norman Herrlich, MD    Referring MD: Everlean Cherry, MD    ASSESSMENT:    1. Hypertensive heart disease with chronic diastolic congestive heart failure (HCC)   2. Permanent atrial fibrillation (HCC)   3. Chronic anticoagulation   4. Nonrheumatic aortic valve stenosis   5. Nausea    PLAN:    In order of problems listed above:  1. Improved she is cleared edema blood pressure is at target for this patient she will continue her current loop diuretic and her daughter will use her judgment giving her metolazone up to once a week as needed to avoid admissions to the hospital recheck labs including renal function potassium proBNP today 2. Stable rate controlled continue anticoagulant but stop aspirin 3. Echocardiogram pending for aortic stenosis 4. This may be something as simple as Eliquis plus aspirin she will stop her aspirin hopefully will improve and not require GI evaluation endoscopy   Next appointment: 3 months   Medication Adjustments/Labs and Tests Ordered: Current medicines are reviewed at length with the patient today.  Concerns regarding medicines are outlined above.  No orders of the defined types were placed in this encounter.  No orders of the defined types were placed in this encounter.   Chief Complaint  Patient presents with  . Follow-up  . Congestive Heart Failure    History of Present Illness:    Donna Ballard is a 86 y.o. female with a hx of CAD previous PCI mild aortic stenosis heart failure hypertension hyperlipidemia last seen last week with decompensated heart failure as an urgent office evaluation.  At her last visit she was in decompensated heart failure we intensified her diuretic and placed her metolazone.  Her weight is down in the range of 7 or 8 pounds and she is resolved her edema and shortness of breath.     Compliance with diet, lifestyle and medications: Yes she has a very attentive intelligent family to participate in her care Her daughter is present participates in the evaluation decision making. Presently no chest pain shortness of breath palpitation or syncope edema has cleared.  She contains of constant nausea takes both Eliquis and aspirin and will stop aspirin.  She has discussed this with her PCP but declined having GI evaluation and endoscopy.  She takes a PPI. Past Medical History:  Diagnosis Date  . Acute respiratory failure with hypoxia (HCC) 06/15/2018  . Aortic stenosis 07/06/2018  . Arthritis   . Atrial enlargement, bilateral 07/06/2018  . Atrial fibrillation (HCC)    a. dx 05/2018, rate control strategy for now.  . Body mass index (BMI) of 40.0-44.9 in adult (HCC) 11/04/2015  . CAD in native artery    a. NSTEMI 05/2018 s/p DES to LAD with residual dz treated medically.  . Chronic anticoagulation 07/06/2018  . Chronic bronchitis (HCC)   . Chronic diastolic CHF (congestive heart failure) (HCC)   . Chronic diastolic heart failure (HCC) 06/15/2018  . Coronary artery disease involving native coronary artery of native heart with unstable angina pectoris (HCC) 06/15/2018  . Deep venous insufficiency 07/22/2018  . Diabetes mellitus with polyneuropathy (HCC) 07/22/2018  . Diabetes mellitus without complication (HCC)   . Esophageal reflux 07/22/2018  . Essential hypertension 03/20/2015  . Frailty   . Gait instability   . Gastritis medicamentosa 04/29/2018  . GERD (  gastroesophageal reflux disease)   . HLD (hyperlipidemia)   . HTN (hypertension)   . Hyperlipidemia 06/15/2018  . Hypertension   . Hypertensive heart disease 06/15/2018  . Hypokalemia   . Insulin dependent diabetes mellitus   . Leukocytosis 06/15/2018  . Mitral regurgitation 07/06/2018  . Morbid obesity (HCC) 06/15/2018  . Muscle pain   . Neuropathy due to medical condition (HCC) 07/22/2018  . Normocytic anemia   .  NSTEMI (non-ST elevated myocardial infarction) (HCC) 06/12/2018  . Osteoarthritis   . Osteopenia 07/22/2018  . Persistent atrial fibrillation (HCC) 06/15/2018  . Pure hypercholesterolemia 07/22/2018  . PVC's (premature ventricular contractions)   . Swelling   . Urge incontinence 07/22/2018  . Urinary incontinence 07/22/2018  . Visual impairment 07/22/2018  . Vitamin D deficiency 07/22/2018    Past Surgical History:  Procedure Laterality Date  . ABDOMINAL HYSTERECTOMY    . CARDIAC CATHETERIZATION    . CHOLECYSTECTOMY    . CORONARY ANGIOPLASTY    . CORONARY STENT INTERVENTION N/A 06/13/2018   Procedure: CORONARY STENT INTERVENTION;  Surgeon: Yvonne Kendall, MD;  Location: [facility];  Service: Cardiovascular;  Laterality: N/A;  . LEFT HEART CATH AND CORONARY ANGIOGRAPHY N/A 06/13/2018   Procedure: LEFT HEART CATH AND CORONARY ANGIOGRAPHY;  Surgeon: Yvonne Kendall, MD;  Location: [facility];  Service: Cardiovascular;  Laterality: N/A;  . parithyroid surgery     09/2013  . TONSILLECTOMY      Current Medications: Current Meds  Medication Sig  . acetaminophen (TYLENOL) 500 MG tablet Take 500 mg by mouth daily as needed.  Marland Kitchen aspirin EC 81 MG tablet Take 1 tablet (81 mg total) by mouth daily.  . carvedilol (COREG) 25 MG tablet Take 25 mg by mouth 2 (two) times daily with a meal.  . cholecalciferol (VITAMIN D3) 25 MCG (1000 UT) tablet Take 1,000 Units by mouth daily.  Marland Kitchen ELIQUIS 5 MG TABS tablet TAKE 1 TABLET BY MOUTH TWICE DAILY.  Marland Kitchen esomeprazole (NEXIUM) 40 MG capsule Take 40 mg by mouth daily at 12 noon.  . fluticasone (FLONASE) 50 MCG/ACT nasal spray USE ONE SPRAY IN EACH NOSTRIL TWICE DAILY  . folic acid (FOLVITE) 800 MCG tablet Take 800 mcg by mouth daily.   Marland Kitchen glucose blood test strip 1 each by Other route as needed for other. Use as instructed  . insulin glargine (LANTUS) 100 UNIT/ML injection Inject 10 Units into the skin daily.   . Insulin Syringe-Needle U-100 (INSULIN  SYRINGE 1CC/31GX5/16") 31G X 5/16" 1 ML MISC Use one daily for DM2 E11.9 on insulin  . metolazone (ZAROXOLYN) 2.5 MG tablet Take 1 tablet (2.5 mg total) by mouth daily.  . Multiple Vitamins-Minerals (PRESERVISION AREDS PO) Take 1 capsule by mouth every morning.  . nitroGLYCERIN (NITROSTAT) 0.4 MG SL tablet Place 0.4 mg under the tongue every 5 (five) minutes as needed for chest pain.  Marland Kitchen Potassium Chloride ER 20 MEQ TBCR Take 20 mEq by mouth 2 (two) times daily.  Marland Kitchen Propylene Glycol (SYSTANE BALANCE OP) Apply 1 drop to eye 2 (two) times daily.  Marland Kitchen torsemide (DEMADEX) 20 MG tablet TAKE TWO TABLETS BY MOUTH TWICE DAILY *TAKE 1 EXTRA TABLET IN THE AFTERNOON EVERY OTHER DAY*     Allergies:   Amlodipine besylate, Metformin, Celecoxib, Enalapril maleate, Estrogens, Fluvastatin, Furosemide, Hydrochlorothiazide, Irbesartan, Lisinopril, Losartan, Pantoprazole, Trandolapril, and Valsartan   Social History   Socioeconomic History  . Marital status: Widowed    Spouse name: Not on file  . Number  of children: Not on file  . Years of education: Not on file  . Highest education level: Not on file  Occupational History  . Not on file  Tobacco Use  . Smoking status: Never Smoker  . Smokeless tobacco: Never Used  Vaping Use  . Vaping Use: Never used  Substance and Sexual Activity  . Alcohol use: No  . Drug use: No  . Sexual activity: Not on file  Other Topics Concern  . Not on file  Social History Narrative   ** Merged History Encounter **       Social Determinants of Health   Financial Resource Strain:   . Difficulty of Paying Living Expenses:   Food Insecurity:   . Worried About Programme researcher, broadcasting/film/video in the Last Year:   . Barista in the Last Year:   Transportation Needs:   . Freight forwarder (Medical):   Marland Kitchen Lack of Transportation (Non-Medical):   Physical Activity:   . Days of Exercise per Week:   . Minutes of Exercise per Session:   Stress:   . Feeling of Stress :   Social  Connections:   . Frequency of Communication with Friends and Family:   . Frequency of Social Gatherings with Friends and Family:   . Attends Religious Services:   . Active Member of Clubs or Organizations:   . Attends Banker Meetings:   Marland Kitchen Marital Status:      Family History: The patient's family history includes Diabetes in her sister. ROS:   Please see the history of present illness.    All other systems reviewed and are negative.  EKGs/Labs/Other Studies Reviewed:    The following studies were reviewed today:    Recent Labs: 12/14/2019 [facility] CMP random glucose 120 GFR 51 cc potassium 4.1 creatinine 0.99 normal liver function test A1c 7.0 05/15/2019: Magnesium 2.3 10/03/2019: BUN 17; Creatinine, Ser 0.88; Hemoglobin 13.8; NT-Pro BNP 1,579; Platelets 324; Potassium 4.1; Sodium 141  Recent Lipid Panel No results found for: CHOL, TRIG, HDL, CHOLHDL, VLDL, LDLCALC, LDLDIRECT  Physical Exam:    VS:  BP (!) 150/66   Pulse 64   Ht 5\' 2"  (1.575 m)   Wt 211 lb 3.2 oz (95.8 kg)   SpO2 97%   BMI 38.63 kg/m     Wt Readings from Last 3 Encounters:  01/19/20 211 lb 3.2 oz (95.8 kg)  01/11/20 218 lb (98.9 kg)  10/03/19 218 lb 12.8 oz (99.2 kg)     GEN: Appears her age as opposed to last week she is not breathless at rest well nourished, well developed in no acute distress HEENT: Normal NECK: No JVD; No carotid bruits LYMPHATICS: No lymphadenopathy CARDIAC: 1/6 to 2/6 aortic outflow murmur RRR, no  rubs, gallops RESPIRATORY:  Clear to auscultation without rales, wheezing or rhonchi  ABDOMEN: Soft, non-tender, non-distended MUSCULOSKELETAL:  No edema; No deformity  SKIN: Warm and dry NEUROLOGIC:  Alert and oriented x 3 PSYCHIATRIC:  Normal affect    Signed, 10/05/19, MD  01/19/2020 2:51 PM    [facility]

## 2020-01-19 NOTE — Patient Instructions (Signed)
Medication Instructions:  Your physician has recommended you make the following change in your medication:  STOP: Aspirin CHANGE: Metolazone 2.5 mg take one tablet by mouth weekly as needed.  *If you need a refill on your cardiac medications before your next appointment, please call your pharmacy*   Lab Work: Your physician recommends that you return for lab work in: TODAY CBC, BMP, ProBNP, Lipids If you have labs (blood work) drawn today and your tests are completely normal, you will receive your results only by: Marland Kitchen MyChart Message (if you have MyChart) OR . A paper copy in the mail If you have any lab test that is abnormal or we need to change your treatment, we will call you to review the results.   Testing/Procedures: None   Follow-Up: At [facility], you and your health needs are our priority.  As part of our continuing mission to provide you with exceptional heart care, we have created designated Provider Care Teams.  These Care Teams include your primary Cardiologist (physician) and Advanced Practice Providers (APPs -  Physician Assistants and Nurse Practitioners) who all work together to provide you with the care you need, when you need it.  We recommend signing up for the patient portal called "MyChart".  Sign up information is provided on this After Visit Summary.  MyChart is used to connect with patients for Virtual Visits (Telemedicine).  Patients are able to view lab/test results, encounter notes, upcoming appointments, etc.  Non-urgent messages can be sent to your provider as well.   To learn more about what you can do with MyChart, go to [email or web address].    Your next appointment:   3 month(s)  The format for your next appointment:   In Person  Provider:   Norman Herrlich, MD   Other Instructions

## 2020-01-19 NOTE — Progress Notes (Deleted)
Cardiology Office Note:    Date:  01/19/2020   ID:  Donna Ballard, DOB 07-01-1931, MRN [id number]  PCP:  Everlean Cherry, MD  Cardiologist:  Norman Herrlich, MD    Referring MD: Everlean Cherry, MD    ASSESSMENT:    No diagnosis found. PLAN:    In order of problems listed above:  1. ***   Next appointment: ***   Medication Adjustments/Labs and Tests Ordered: Current medicines are reviewed at length with the patient today.  Concerns regarding medicines are outlined above.  No orders of the defined types were placed in this encounter.  No orders of the defined types were placed in this encounter.   No chief complaint on file.   History of Present Illness:    Donna Ballard is a 86 y.o. female with a hx of CAD NSTEMI with PCI and stent to LAD 05/2018, diastolic heart failure, atrial fibrillation, HTN, HLD, Mild aortic valve stenosis and mild mitral regurgitation.. Echocardiogram performed 06/14/2018 showed mild aortic stenosis mean gradient 11 mmHg.  She last seen 01/11/2020 with decompensated heart failure.. Compliance with diet, lifestyle and medications: *** Past Medical History:  Diagnosis Date  . Acute respiratory failure with hypoxia (HCC) 06/15/2018  . Aortic stenosis 07/06/2018  . Arthritis   . Atrial enlargement, bilateral 07/06/2018  . Atrial fibrillation (HCC)    a. dx 05/2018, rate control strategy for now.  . Body mass index (BMI) of 40.0-44.9 in adult (HCC) 11/04/2015  . CAD in native artery    a. NSTEMI 05/2018 s/p DES to LAD with residual dz treated medically.  . Chronic anticoagulation 07/06/2018  . Chronic bronchitis (HCC)   . Chronic diastolic CHF (congestive heart failure) (HCC)   . Chronic diastolic heart failure (HCC) 06/15/2018  . Coronary artery disease involving native coronary artery of native heart with unstable angina pectoris (HCC) 06/15/2018  . Deep venous insufficiency 07/22/2018  . Diabetes mellitus with polyneuropathy (HCC) 07/22/2018  . Diabetes  mellitus without complication (HCC)   . Esophageal reflux 07/22/2018  . Essential hypertension 03/20/2015  . Frailty   . Gait instability   . Gastritis medicamentosa 04/29/2018  . GERD (gastroesophageal reflux disease)   . HLD (hyperlipidemia)   . HTN (hypertension)   . Hyperlipidemia 06/15/2018  . Hypertension   . Hypertensive heart disease 06/15/2018  . Hypokalemia   . Insulin dependent diabetes mellitus   . Leukocytosis 06/15/2018  . Mitral regurgitation 07/06/2018  . Morbid obesity (HCC) 06/15/2018  . Muscle pain   . Neuropathy due to medical condition (HCC) 07/22/2018  . Normocytic anemia   . NSTEMI (non-ST elevated myocardial infarction) (HCC) 06/12/2018  . Osteoarthritis   . Osteopenia 07/22/2018  . Persistent atrial fibrillation (HCC) 06/15/2018  . Pure hypercholesterolemia 07/22/2018  . PVC's (premature ventricular contractions)   . Swelling   . Urge incontinence 07/22/2018  . Urinary incontinence 07/22/2018  . Visual impairment 07/22/2018  . Vitamin D deficiency 07/22/2018    Past Surgical History:  Procedure Laterality Date  . ABDOMINAL HYSTERECTOMY    . CARDIAC CATHETERIZATION    . CHOLECYSTECTOMY    . CORONARY ANGIOPLASTY    . CORONARY STENT INTERVENTION N/A 06/13/2018   Procedure: CORONARY STENT INTERVENTION;  Surgeon: Yvonne Kendall, MD;  Location: [facility];  Service: Cardiovascular;  Laterality: N/A;  . LEFT HEART CATH AND CORONARY ANGIOGRAPHY N/A 06/13/2018   Procedure: LEFT HEART CATH AND CORONARY ANGIOGRAPHY;  Surgeon: Yvonne Kendall, MD;  Location: [facility] INVASIVE CV LAB;  Service:  Cardiovascular;  Laterality: N/A;  . parithyroid surgery     09/2013  . TONSILLECTOMY      Current Medications: No outpatient medications have been marked as taking for the 01/19/20 encounter (Appointment) with Baldo Daub, MD.     Allergies:   Amlodipine besylate, Metformin, Celecoxib, Enalapril maleate, Estrogens, Fluvastatin, Furosemide, Hydrochlorothiazide, Irbesartan,  Lisinopril, Losartan, Pantoprazole, Trandolapril, and Valsartan   Social History   Socioeconomic History  . Marital status: Widowed    Spouse name: Not on file  . Number of children: Not on file  . Years of education: Not on file  . Highest education level: Not on file  Occupational History  . Not on file  Tobacco Use  . Smoking status: Never Smoker  . Smokeless tobacco: Never Used  Vaping Use  . Vaping Use: Never used  Substance and Sexual Activity  . Alcohol use: No  . Drug use: No  . Sexual activity: Not on file  Other Topics Concern  . Not on file  Social History Narrative   ** Merged History Encounter **       Social Determinants of Health   Financial Resource Strain:   . Difficulty of Paying Living Expenses:   Food Insecurity:   . Worried About Programme researcher, broadcasting/film/video in the Last Year:   . Barista in the Last Year:   Transportation Needs:   . Freight forwarder (Medical):   Marland Kitchen Lack of Transportation (Non-Medical):   Physical Activity:   . Days of Exercise per Week:   . Minutes of Exercise per Session:   Stress:   . Feeling of Stress :   Social Connections:   . Frequency of Communication with Friends and Family:   . Frequency of Social Gatherings with Friends and Family:   . Attends Religious Services:   . Active Member of Clubs or Organizations:   . Attends Banker Meetings:   Marland Kitchen Marital Status:      Family History: The patient's ***family history includes Diabetes in her sister. ROS:   Please see the history of present illness.    All other systems reviewed and are negative.  EKGs/Labs/Other Studies Reviewed:    The following studies were reviewed today:  EKG:  EKG ordered today and personally reviewed.  The ekg ordered today demonstrates ***  Recent Labs: 05/15/2019: Magnesium 2.3 10/03/2019: BUN 17; Creatinine, Ser 0.88; Hemoglobin 13.8; NT-Pro BNP 1,579; Platelets 324; Potassium 4.1; Sodium 141  Recent Lipid Panel No  results found for: CHOL, TRIG, HDL, CHOLHDL, VLDL, LDLCALC, LDLDIRECT  Physical Exam:    VS:  There were no vitals taken for this visit.    Wt Readings from Last 3 Encounters:  01/11/20 218 lb (98.9 kg)  10/03/19 218 lb 12.8 oz (99.2 kg)  06/28/19 218 lb 6.4 oz (99.1 kg)     GEN: *** Well nourished, well developed in no acute distress HEENT: Normal NECK: No JVD; No carotid bruits LYMPHATICS: No lymphadenopathy CARDIAC: ***RRR, no murmurs, rubs, gallops RESPIRATORY:  Clear to auscultation without rales, wheezing or rhonchi  ABDOMEN: Soft, non-tender, non-distended MUSCULOSKELETAL:  No edema; No deformity  SKIN: Warm and dry NEUROLOGIC:  Alert and oriented x 3 PSYCHIATRIC:  Normal affect    Signed, Norman Herrlich, MD  01/19/2020 10:48 AM    [facility]

## 2020-01-20 LAB — BASIC METABOLIC PANEL
BUN/Creatinine Ratio: 29 — ABNORMAL HIGH (ref 12–28)
BUN: 39 mg/dL — ABNORMAL HIGH (ref 8–27)
CO2: 30 mmol/L — ABNORMAL HIGH (ref 20–29)
Calcium: 9.3 mg/dL (ref 8.7–10.3)
Chloride: 84 mmol/L — ABNORMAL LOW (ref 96–106)
Creatinine, Ser: 1.33 mg/dL — ABNORMAL HIGH (ref 0.57–1.00)
GFR calc Af Amer: 41 mL/min/{1.73_m2} — ABNORMAL LOW (ref >59)
GFR calc non Af Amer: 36 mL/min/{1.73_m2} — ABNORMAL LOW (ref >59)
Glucose: 176 mg/dL — ABNORMAL HIGH (ref 65–99)
Potassium: 3.3 mmol/L — ABNORMAL LOW (ref 3.5–5.2)
Sodium: 131 mmol/L — ABNORMAL LOW (ref 134–144)

## 2020-01-20 LAB — LIPID PANEL
Chol/HDL Ratio: 7.1 ratio — ABNORMAL HIGH (ref 0.0–4.4)
Cholesterol, Total: 250 mg/dL — ABNORMAL HIGH (ref 100–199)
HDL: 35 mg/dL — ABNORMAL LOW (ref >39)
LDL Chol Calc (NIH): 146 mg/dL — ABNORMAL HIGH (ref 0–99)
Triglycerides: 371 mg/dL — ABNORMAL HIGH (ref 0–149)
VLDL Cholesterol Cal: 69 mg/dL — ABNORMAL HIGH (ref 5–40)

## 2020-01-20 LAB — CBC
Hematocrit: 44.2 % (ref 34.0–46.6)
Hemoglobin: 14.6 g/dL (ref 11.1–15.9)
MCH: 28.3 pg (ref 26.6–33.0)
MCHC: 33 g/dL (ref 31.5–35.7)
MCV: 86 fL (ref 79–97)
Platelets: 336 10*3/uL (ref 150–450)
RBC: 5.16 x10E6/uL (ref 3.77–5.28)
RDW: 13.6 % (ref 11.7–15.4)
WBC: 13.2 10*3/uL — ABNORMAL HIGH (ref 3.4–10.8)

## 2020-01-20 LAB — PRO B NATRIURETIC PEPTIDE: NT-Pro BNP: 1242 pg/mL — ABNORMAL HIGH (ref 0–738)

## 2020-01-23 ENCOUNTER — Telehealth: Payer: Self-pay

## 2020-01-23 MED ORDER — POTASSIUM CHLORIDE ER 20 MEQ PO TBCR: 20.0000 meq | EXTENDED_RELEASE_TABLET | Freq: Three times a day (TID) | ORAL | 3 refills | Status: DC

## 2020-01-23 NOTE — Telephone Encounter (Signed)
Results given to [facility] (pt's dgt ok per DPR) as per Dr. Dulce Sellar. Angelique Blonder verbalized understanding and had no additional questions.

## 2020-01-23 NOTE — Telephone Encounter (Signed)
Left message on patients voicemail to please return our call.   Prescription for potassium already sent in.

## 2020-01-23 NOTE — Telephone Encounter (Signed)
Pt's daughter is returning phone call. Please advise.

## 2020-01-25 ENCOUNTER — Ambulatory Visit (INDEPENDENT_AMBULATORY_CARE_PROVIDER_SITE_OTHER): Payer: Medicare Other

## 2020-01-25 ENCOUNTER — Other Ambulatory Visit: Payer: Self-pay

## 2020-01-25 DIAGNOSIS — I4821 Permanent atrial fibrillation: Secondary | ICD-10-CM

## 2020-01-25 DIAGNOSIS — I350 Nonrheumatic aortic (valve) stenosis: Secondary | ICD-10-CM

## 2020-01-25 DIAGNOSIS — I110 Hypertensive heart disease with heart failure: Secondary | ICD-10-CM | POA: Diagnosis not present

## 2020-01-25 DIAGNOSIS — I5032 Chronic diastolic (congestive) heart failure: Secondary | ICD-10-CM | POA: Diagnosis not present

## 2020-01-25 DIAGNOSIS — Z7901 Long term (current) use of anticoagulants: Secondary | ICD-10-CM | POA: Diagnosis not present

## 2020-01-25 NOTE — Progress Notes (Signed)
Complete echocardiogram has been performed.  Jimmy Derick [place] RDCS, RVT 

## 2020-01-29 ENCOUNTER — Telehealth: Payer: Self-pay | Admitting: Cardiology

## 2020-01-29 NOTE — Telephone Encounter (Signed)
Patient returning call for echo results. 

## 2020-01-29 NOTE — Telephone Encounter (Signed)
Called back and informed patient daughter of results per dpr.

## 2020-01-30 ENCOUNTER — Telehealth: Payer: Self-pay | Admitting: Cardiology

## 2020-01-30 NOTE — Telephone Encounter (Signed)
Spoke to the patients daughter just now and let her know that per Dr. Dulce Sellar the patients BP being 1017/62 is fine. He advised that he would just call back if they began to see BP's that are consistently below 100 on top. She verbalizes understanding and thanks me for the call back.

## 2020-01-30 NOTE — Telephone Encounter (Signed)
Steward Drone, patient's daughter, is calling to get some guidance on when the patient's BP is too low. She said that the patient's fluid pill was increased on her 7/2 visit, which is why she is more concerned. Steward Drone said that on 7/2 at 4pm, it was 170/80. Today it was 107/62. Please advise.

## 2020-04-12 ENCOUNTER — Other Ambulatory Visit: Payer: Self-pay | Admitting: Cardiology

## 2020-04-12 NOTE — Telephone Encounter (Signed)
Prescription refill request for Eliquis received. Indication:  Atrial Fibrillation Last office visit: 01/2020  [facility] Scr: 1.33 01/2020 Age: 86 Weight: 95.8 kg  Prescription refilled

## 2020-04-19 DIAGNOSIS — R609 Edema, unspecified: Secondary | ICD-10-CM | POA: Insufficient documentation

## 2020-04-19 DIAGNOSIS — E119 Type 2 diabetes mellitus without complications: Secondary | ICD-10-CM | POA: Insufficient documentation

## 2020-04-19 DIAGNOSIS — J42 Unspecified chronic bronchitis: Secondary | ICD-10-CM | POA: Insufficient documentation

## 2020-04-19 DIAGNOSIS — I4891 Unspecified atrial fibrillation: Secondary | ICD-10-CM | POA: Insufficient documentation

## 2020-04-19 DIAGNOSIS — R54 Age-related physical debility: Secondary | ICD-10-CM | POA: Insufficient documentation

## 2020-04-19 DIAGNOSIS — M1990 Unspecified osteoarthritis, unspecified site: Secondary | ICD-10-CM | POA: Insufficient documentation

## 2020-04-19 DIAGNOSIS — K219 Gastro-esophageal reflux disease without esophagitis: Secondary | ICD-10-CM | POA: Insufficient documentation

## 2020-04-19 DIAGNOSIS — I2510 Atherosclerotic heart disease of native coronary artery without angina pectoris: Secondary | ICD-10-CM | POA: Insufficient documentation

## 2020-04-19 DIAGNOSIS — I5032 Chronic diastolic (congestive) heart failure: Secondary | ICD-10-CM | POA: Insufficient documentation

## 2020-04-19 DIAGNOSIS — I493 Ventricular premature depolarization: Secondary | ICD-10-CM | POA: Insufficient documentation

## 2020-04-19 DIAGNOSIS — E785 Hyperlipidemia, unspecified: Secondary | ICD-10-CM | POA: Insufficient documentation

## 2020-04-19 DIAGNOSIS — M791 Myalgia, unspecified site: Secondary | ICD-10-CM | POA: Insufficient documentation

## 2020-04-19 DIAGNOSIS — I10 Essential (primary) hypertension: Secondary | ICD-10-CM | POA: Insufficient documentation

## 2020-04-22 ENCOUNTER — Encounter: Payer: Self-pay | Admitting: Cardiology

## 2020-04-22 ENCOUNTER — Other Ambulatory Visit: Payer: Self-pay

## 2020-04-22 ENCOUNTER — Ambulatory Visit (INDEPENDENT_AMBULATORY_CARE_PROVIDER_SITE_OTHER): Payer: Medicare Other | Admitting: Cardiology

## 2020-04-22 VITALS — BP 144/73 | HR 78 | Ht 62.0 in | Wt 213.6 lb

## 2020-04-22 DIAGNOSIS — I25110 Atherosclerotic heart disease of native coronary artery with unstable angina pectoris: Secondary | ICD-10-CM | POA: Diagnosis not present

## 2020-04-22 DIAGNOSIS — I350 Nonrheumatic aortic (valve) stenosis: Secondary | ICD-10-CM

## 2020-04-22 DIAGNOSIS — Z7901 Long term (current) use of anticoagulants: Secondary | ICD-10-CM | POA: Diagnosis not present

## 2020-04-22 DIAGNOSIS — I5032 Chronic diastolic (congestive) heart failure: Secondary | ICD-10-CM

## 2020-04-22 DIAGNOSIS — I110 Hypertensive heart disease with heart failure: Secondary | ICD-10-CM

## 2020-04-22 DIAGNOSIS — I4821 Permanent atrial fibrillation: Secondary | ICD-10-CM | POA: Diagnosis not present

## 2020-04-22 NOTE — Progress Notes (Signed)
Cardiology Office Note:    Date:  04/22/2020   ID:  Donna Ballard, DOB [birth year], MRN [id number]  PCP:  Everlean Cherry, MD  Cardiologist:  Norman Herrlich, MD    Referring MD: Everlean Cherry, MD    ASSESSMENT:    1. Chronic diastolic heart failure (HCC)   2. Hypertensive heart disease with chronic diastolic congestive heart failure (HCC)   3. Permanent atrial fibrillation (HCC)   4. Chronic anticoagulation   5. Coronary artery disease involving native coronary artery of native heart with unstable angina pectoris (HCC)   6. Nonrheumatic aortic valve stenosis    PLAN:    In order of problems listed above:  1. She is doing better heart failure is compensated she has no fluid overload New York Heart Association class I to class II continue her current distal diuretic and intermittent Zaroxolyn recheck renal function sodium and potassium. 2. BP at target continue current treatment including beta-blocker with atrial fibrillation and diuretics 3. Rate is controlled continue beta-blocker and anticoagulant. 4. Stable CAD having no angina current medical therapy New York Heart Association class I.  Note she has refused lipid-lowering treatment 5. Stable she does not have progressive or severe aortic stenosis 6. Strongly encouraged her to follow through with ophthalmology regarding her visual loss.   Next appointment: 3 months or sooner if she has recurrent decompensation of heart failure.  We are fortunately able to manage her as an outpatient last time   Medication Adjustments/Labs and Tests Ordered: Current medicines are reviewed at length with the patient today.  Concerns regarding medicines are outlined above.  Orders Placed This Encounter  Procedures  . Basic metabolic panel  . Pro b natriuretic peptide (BNP)  . EKG 12-Lead   No orders of the defined types were placed in this encounter.   No chief complaint on file.   History of Present Illness:    Donna Ballard is a 86 y.o.  female with a hx of CAD previous PCI mild aortic stenosis heart failure hypertension hyperlipidemia  last seen 01/19/2020 after an episode of decompensated heart failure responding to intensification of oral diuretic.  Compliance with diet, lifestyle and medications: Yes  I reviewed the results of the echocardiogram and EKG with the patient and her daughter participate in evaluation decision making. She is doing better supervised by her daughter who intermittently gives her metolazone and manages her heart failure. No complaints of shortness of breath orthopnea edema chest pain palpitation or syncope. Her biggest concern is progressive visual loss and loss of smell and taste. She has had no bleeding complication of her anticoagulant.  Her echocardiogram performed 01/25/2020 ejection fraction preserved at 55 to 60% moderate left atrial enlargement mild mitral regurgitation with mild thickened aortic valve and no finding of aortic stenosis. Past Medical History:  Diagnosis Date  . Acute respiratory failure with hypoxia (HCC) 06/15/2018  . Aortic stenosis 07/06/2018  . Arthritis   . Atrial enlargement, bilateral 07/06/2018  . Atrial fibrillation (HCC)    a. dx 05/2018, rate control strategy for now.  . Body mass index (BMI) of 40.0-44.9 in adult (HCC) 11/04/2015  . CAD in native artery    a. NSTEMI 05/2018 s/p DES to LAD with residual dz treated medically.  . Chronic anticoagulation 07/06/2018  . Chronic bronchitis (HCC)   . Chronic diastolic CHF (congestive heart failure) (HCC)   . Chronic diastolic heart failure (HCC) 06/15/2018  . Coronary artery disease involving native coronary artery of native heart with  unstable angina pectoris (HCC) 06/15/2018  . Deep venous insufficiency 07/22/2018  . Diabetes mellitus with polyneuropathy (HCC) 07/22/2018  . Diabetes mellitus without complication (HCC)   . Esophageal reflux 07/22/2018  . Essential hypertension 03/20/2015  . Frailty   . Gait instability    . Gastritis medicamentosa 04/29/2018  . GERD (gastroesophageal reflux disease)   . HLD (hyperlipidemia)   . HTN (hypertension)   . Hyperlipidemia 06/15/2018  . Hypertension   . Hypertensive heart disease 06/15/2018  . Hypokalemia   . Insulin dependent diabetes mellitus   . Leukocytosis 06/15/2018  . Mitral regurgitation 07/06/2018  . Morbid obesity (HCC) 06/15/2018  . Muscle pain   . Neuropathy due to medical condition (HCC) 07/22/2018  . Normocytic anemia   . NSTEMI (non-ST elevated myocardial infarction) (HCC) 06/12/2018  . Osteoarthritis   . Osteopenia 07/22/2018  . Persistent atrial fibrillation (HCC) 06/15/2018  . Pure hypercholesterolemia 07/22/2018  . PVC's (premature ventricular contractions)   . Swelling   . Urge incontinence 07/22/2018  . Urinary incontinence 07/22/2018  . Visual impairment 07/22/2018  . Vitamin D deficiency 07/22/2018    Past Surgical History:  Procedure Laterality Date  . ABDOMINAL HYSTERECTOMY    . CARDIAC CATHETERIZATION    . CHOLECYSTECTOMY    . CORONARY ANGIOPLASTY    . CORONARY STENT INTERVENTION N/A 06/13/2018   Procedure: CORONARY STENT INTERVENTION;  Surgeon: Yvonne Kendall, MD;  Location: [facility] INVASIVE CV LAB;  Service: Cardiovascular;  Laterality: N/A;  . LEFT HEART CATH AND CORONARY ANGIOGRAPHY N/A 06/13/2018   Procedure: LEFT HEART CATH AND CORONARY ANGIOGRAPHY;  Surgeon: Yvonne Kendall, MD;  Location: [facility] INVAS[facility] CV LAB;  Service: Cardiovascular;  Laterality: N/A;  . parithyroid surgery     09/2013  . TONSILLECTOMY      Current Medications: Current Meds  Medication Sig  . acetaminophen (TYLENOL) 500 MG tablet Take 500 mg by mouth daily as needed.  . carvedilol (COREG) 25 MG tablet Take 25 mg by mouth 2 (two) times daily with a meal.  . cholecalciferol (VITAMIN D3) 25 MCG (1000 UT) tablet Take 1,000 Units by mouth daily.  Marland Kitchen ELIQUIS 5 MG TABS tablet TAKE 1 TABLET BY MOUTH TWICE DAILY.  Marland Kitchen esomeprazole (NEXIUM) 40 MG capsule Take 40 mg by  mouth daily at 12 noon.  . fluticasone (FLONASE) 50 MCG/ACT nasal spray USE ONE SPRAY IN EACH NOSTRIL TWICE DAILY  . glucose blood test strip 1 each by Other route as needed for other. Use as instructed  . insulin glargine (LANTUS) 100 UNIT/ML injection Inject 10 Units into the skin daily.   . Insulin Syringe-Needle U-100 (INSULIN SYRINGE 1CC/31GX5/16") 31G X 5/16" 1 ML MISC Use one daily for DM2 E11.9 on insulin  . metolazone (ZAROXOLYN) 2.5 MG tablet Take one tablet by mouth weekly as needed.  . Multiple Vitamins-Minerals (PRESERVISION AREDS PO) Take 1 capsule by mouth every morning.  . nitroGLYCERIN (NITROSTAT) 0.4 MG SL tablet Place 0.4 mg under the tongue every 5 (five) minutes as needed for chest pain.  Marland Kitchen Potassium Chloride ER 20 MEQ TBCR Take 20 mEq by mouth 3 (three) times daily.  Marland Kitchen Propylene Glycol (SYSTANE BALANCE OP) Apply 1 drop to eye 2 (two) times daily.  Marland Kitchen torsemide (DEMADEX) 20 MG tablet TAKE TWO TABLETS BY MOUTH TWICE DAILY *TAKE 1 EXTRA TABLET IN THE AFTERNOON EVERY OTHER DAY*     Allergies:   Amlodipine besylate, Metformin, Celecoxib, Enalapril maleate, Estrogens, Fluvastatin, Furosemide, Hydrochlorothiazide, Irbesartan, Lisinopril, Losartan, Pantoprazole, Trandolapril, and Valsartan  Social History   Socioeconomic History  . Marital status: Widowed    Spouse name: Not on file  . Number of children: Not on file  . Years of education: Not on file  . Highest education level: Not on file  Occupational History  . Not on file  Tobacco Use  . Smoking status: Never Smoker  . Smokeless tobacco: Never Used  Vaping Use  . Vaping Use: Never used  Substance and Sexual Activity  . Alcohol use: No  . Drug use: No  . Sexual activity: Not on file  Other Topics Concern  . Not on file  Social History Narrative   ** Merged History Encounter **       Social Determinants of Health   Financial Resource Strain:   . Difficulty of Paying Living Expenses: Not on file  Food  Insecurity:   . Worried About Programme researcher, broadcasting/film/video in the Last Year: Not on file  . Ran Out of Food in the Last Year: Not on file  Transportation Needs:   . Lack of Transportation (Medical): Not on file  . Lack of Transportation (Non-Medical): Not on file  Physical Activity:   . Days of Exercise per Week: Not on file  . Minutes of Exercise per Session: Not on file  Stress:   . Feeling of Stress : Not on file  Social Connections:   . Frequency of Communication with Friends and Family: Not on file  . Frequency of Social Gatherings with Friends and Family: Not on file  . Attends Religious Services: Not on file  . Active Member of Clubs or Organizations: Not on file  . Attends Banker Meetings: Not on file  . Marital Status: Not on file     Family History: The patient's family history includes Diabetes in her sister. ROS:   Please see the history of present illness.    All other systems reviewed and are negative.  EKGs/Labs/Other Studies Reviewed:    The following studies were reviewed today:  EKG:  EKG ordered today and personally reviewed.  The ekg ordered today demonstrates atrial fibrillation controlled ventricular rate  Recent Labs: 05/15/2019: Magnesium 2.3 01/19/2020: BUN 39; Creatinine, Ser 1.33; Hemoglobin 14.6; NT-Pro BNP 1,242; Platelets 336; Potassium 3.3; Sodium 131  Recent Lipid Panel    Component Value Date/Time   CHOL 250 (H) 01/19/2020 1513   TRIG 371 (H) 01/19/2020 1513   HDL 35 (L) 01/19/2020 1513   CHOLHDL 7.1 (H) 01/19/2020 1513   LDLCALC 146 (H) 01/19/2020 1513    Physical Exam:    VS:  BP (!) 144/73   Pulse 78   Ht 5\' 2"  (1.575 m)   Wt 213 lb 9.6 oz (96.9 kg)   SpO2 95%   BMI 39.07 kg/m     Wt Readings from Last 3 Encounters:  04/22/20 213 lb 9.6 oz (96.9 kg)  01/19/20 211 lb 3.2 oz (95.8 kg)  01/11/20 218 lb (98.9 kg)     GEN: Elderly frail well nourished, well developed in no acute distress HEENT: Normal NECK: No JVD; No  carotid bruits LYMPHATICS: No lymphadenopathy CARDIAC: Irregular S1 is variable 1 of 6 left ear murmur  RESPIRATORY:  Clear to auscultation without rales, wheezing or rhonchi  ABDOMEN: Soft, non-tender, non-distended MUSCULOSKELETAL:  No edema; No deformity  SKIN: Warm and dry NEUROLOGIC:  Alert and oriented x 3 PSYCHIATRIC:  Normal affect    Signed, 01/13/20, MD  04/22/2020 4:48 PM    [facility]

## 2020-04-22 NOTE — Patient Instructions (Signed)
Medication Instructions:  Your physician recommends that you continue on your current medications as directed. Please refer to the Current Medication list given to you today.  *If you need a refill on your cardiac medications before your next appointment, please call your pharmacy*   Lab Work: Your physician recommends that you return for lab work in:  BMP, ProBNP If you have labs (blood work) drawn today and your tests are completely normal, you will receive your results only by:  MyChart Message (if you have MyChart) OR  A paper copy in the mail If you have any lab test that is abnormal or we need to change your treatment, we will call you to review the results.   Testing/Procedures: None   Follow-Up: At [facility], you and your health needs are our priority.  As part of our continuing mission to provide you with exceptional heart care, we have created designated Provider Care Teams.  These Care Teams include your primary Cardiologist (physician) and Advanced Practice Providers (APPs -  Physician Assistants and Nurse Practitioners) who all work together to provide you with the care you need, when you need it.  We recommend signing up for the patient portal called "MyChart".  Sign up information is provided on this After Visit Summary.  MyChart is used to connect with patients for Virtual Visits (Telemedicine).  Patients are able to view lab/test results, encounter notes, upcoming appointments, etc.  Non-urgent messages can be sent to your provider as well.   To learn more about what you can do with MyChart, go to [email or web address].    Your next appointment:   3 month(s)  The format for your next appointment:   In Person  Provider:   Norman Herrlich, MD   Other Instructions

## 2020-04-23 ENCOUNTER — Telehealth: Payer: Self-pay

## 2020-04-23 LAB — BASIC METABOLIC PANEL
BUN/Creatinine Ratio: 21 (ref 12–28)
BUN: 23 mg/dL (ref 8–27)
CO2: 24 mmol/L (ref 20–29)
Calcium: 9.4 mg/dL (ref 8.7–10.3)
Chloride: 95 mmol/L — ABNORMAL LOW (ref 96–106)
Creatinine, Ser: 1.08 mg/dL — ABNORMAL HIGH (ref 0.57–1.00)
GFR calc Af Amer: 53 mL/min/{1.73_m2} — ABNORMAL LOW (ref >59)
GFR calc non Af Amer: 46 mL/min/{1.73_m2} — ABNORMAL LOW (ref >59)
Glucose: 150 mg/dL — ABNORMAL HIGH (ref 65–99)
Potassium: 4 mmol/L (ref 3.5–5.2)
Sodium: 141 mmol/L (ref 134–144)

## 2020-04-23 LAB — PRO B NATRIURETIC PEPTIDE: NT-Pro BNP: 1297 pg/mL — ABNORMAL HIGH (ref 0–738)

## 2020-04-23 NOTE — Telephone Encounter (Signed)
Spoke with patients daughter regarding results and recommendation.  She verbalizes understanding and is agreeable to plan of care. Advised patient to call back with any issues or concerns.  

## 2020-04-23 NOTE — Telephone Encounter (Signed)
Left message on patients voicemail to please return our call.   

## 2020-04-23 NOTE — Telephone Encounter (Signed)
Denice returning Morgan's call.

## 2020-05-04 ENCOUNTER — Other Ambulatory Visit: Payer: Self-pay | Admitting: Cardiology

## 2020-05-07 NOTE — Telephone Encounter (Signed)
Refill sent to pharmacy.   

## 2020-07-15 ENCOUNTER — Other Ambulatory Visit: Payer: Self-pay | Admitting: Cardiology

## 2020-07-30 NOTE — Progress Notes (Signed)
Cardiology Office Note:    Date:  07/31/2020   ID:  Donna Ballard, DOB 1931/02/25, MRN [id number]  PCP:  Everlean Cherry, MD  Cardiologist:  Norman Herrlich, MD    Referring MD: Everlean Cherry, MD    ASSESSMENT:    1. Hypertensive heart disease with chronic diastolic congestive heart failure (HCC)   2. Permanent atrial fibrillation (HCC)   3. Chronic anticoagulation   4. Nonrheumatic aortic valve stenosis   5. Coronary artery disease involving native coronary artery of native heart with unstable angina pectoris (HCC)    PLAN:    In order of problems listed above:  1. Heart failure presently has mildly decompensated she began to take metolazone once every 2 weeks to avoid hospitalizations and her daughter will continue close supervision.  They no longer weigh at home. 2. Rate controlled continue beta-blocker and anticoagulant 3. Stable her most recent echocardiogram had no stenotic gradient 4. Stable CAD continue medical therapy including her beta-blocker and is not on lipid-lowering therapy she is decided not to take a statin or other lipid-lowering therapy.  I would not check a lipid profile.   Next appointment: 3 months   Medication Adjustments/Labs and Tests Ordered: Current medicines are reviewed at length with the patient today.  Concerns regarding medicines are outlined above.  No orders of the defined types were placed in this encounter.  Meds ordered this encounter  Medications  . potassium chloride SA (KLOR-CON) 20 MEQ tablet    Sig: Take 1 tablet (20 mEq total) by mouth daily.    Dispense:  90 tablet    Refill:  3    No chief complaint on file.   History of Present Illness:    Donna Ballard is a 87 y.o. female with a hx of CAD previous PCI mild aortic stenosis heart failure hypertension hyperlipidemia  seen 01/19/2020 after an episode of decompensated heart failure responding to intensification of oral diuretic. Her echocardiogram performed 01/25/2020 ejection fraction  preserved at 55 to 60% moderate left atrial enlargement mild mitral regurgitation with mild thickened aortic valve and no finding of aortic stenosis.   She was last seen 04/22/2020.  Compliance with diet, lifestyle and medications: Yes  Recent labs [facility] 07/15/2020: Sodium 139 potassium 4.2 creatinine 0.9 normal liver function test hemoglobin 12.2  She is not weighing at home and her family has noticed recently she has more exertional shortness of breath and wheezes at times she had trouble getting out of the home and coming to the office today.  Her weight in our office is up 5 to 8 pounds and I told the daughter to give her a dose of metolazone tomorrow and plan on doing it every 2 weeks to avoid hospitalizations along with her loop diuretic.  She is not severely short of breath and is not having orthopnea no chest pain palpitation or syncope but she has edema.  Has trouble swallowing potassium tablets her K is normal we negotiated taking 1 a day if she has trouble swallowing I will switch her to liquid. Past Medical History:  Diagnosis Date  . Acute respiratory failure with hypoxia (HCC) 06/15/2018  . Aortic stenosis 07/06/2018  . Arthritis   . Atrial enlargement, bilateral 07/06/2018  . Atrial fibrillation (HCC)    a. dx 05/2018, rate control strategy for now.  . Body mass index (BMI) of 40.0-44.9 in adult (HCC) 11/04/2015  . CAD in native artery    a. NSTEMI 05/2018 s/p DES to LAD  with residual dz treated medically.  . Chronic anticoagulation 07/06/2018  . Chronic bronchitis (HCC)   . Chronic diastolic CHF (congestive heart failure) (HCC)   . Chronic diastolic heart failure (HCC) 06/15/2018  . Coronary artery disease involving native coronary artery of native heart with unstable angina pectoris (HCC) 06/15/2018  . Deep venous insufficiency 07/22/2018  . Diabetes mellitus with polyneuropathy (HCC) 07/22/2018  . Diabetes mellitus without complication (HCC)   . Esophageal  reflux 07/22/2018  . Essential hypertension 03/20/2015  . Frailty   . Gait instability   . Gastritis medicamentosa 04/29/2018  . GERD (gastroesophageal reflux disease)   . HLD (hyperlipidemia)   . HTN (hypertension)   . Hyperlipidemia 06/15/2018  . Hypertension   . Hypertensive heart disease 06/15/2018  . Hypokalemia   . Insulin dependent diabetes mellitus   . Leukocytosis 06/15/2018  . Mitral regurgitation 07/06/2018  . Morbid obesity (HCC) 06/15/2018  . Muscle pain   . Neuropathy due to medical condition (HCC) 07/22/2018  . Normocytic anemia   . NSTEMI (non-ST elevated myocardial infarction) (HCC) 06/12/2018  . Osteoarthritis   . Osteopenia 07/22/2018  . Persistent atrial fibrillation (HCC) 06/15/2018  . Pure hypercholesterolemia 07/22/2018  . PVC's (premature ventricular contractions)   . Swelling   . Urge incontinence 07/22/2018  . Urinary incontinence 07/22/2018  . Visual impairment 07/22/2018  . Vitamin D deficiency 07/22/2018    Past Surgical History:  Procedure Laterality Date  . ABDOMINAL HYSTERECTOMY    . CARDIAC CATHETERIZATION    . CHOLECYSTECTOMY    . CORONARY ANGIOPLASTY    . CORONARY STENT INTERVENTION N/A 06/13/2018   Procedure: CORONARY STENT INTERVENTION;  Surgeon: Yvonne Kendall, MD;  Location: [facility];  Service: Cardiovascular;  Laterality: N/A;  . LEFT HEART CATH AND CORONARY ANGIOGRAPHY N/A 06/13/2018   Procedure: LEFT HEART CATH AND CORONARY ANGIOGRAPHY;  Surgeon: Yvonne Kendall, MD;  Location: [facility] INVASIVE CV LAB;  Service: Cardiovascular;  Laterality: N/A;  . parithyroid surgery     09/2013  . TONSILLECTOMY      Current Medications: Current Meds  Medication Sig  . acetaminophen (TYLENOL) 500 MG tablet Take 500 mg by mouth daily as needed.  . carvedilol (COREG) 25 MG tablet Take 25 mg by mouth 2 (two) times daily with a meal.  . cholecalciferol (VITAMIN D3) 25 MCG (1000 UT) tablet Take 1,000 Units by mouth daily.  Marland Kitchen ELIQUIS 5 MG TABS tablet  TAKE 1 TABLET BY MOUTH TWICE DAILY. (Patient taking differently: Take 5 mg by mouth daily.)  . esomeprazole (NEXIUM) 40 MG capsule Take 40 mg by mouth daily at 12 noon.  . fluticasone (FLONASE) 50 MCG/ACT nasal spray USE ONE SPRAY IN EACH NOSTRIL TWICE DAILY  . glucose blood test strip 1 each by Other route as needed for other. Use as instructed  . insulin glargine (LANTUS) 100 UNIT/ML injection Inject 10 Units into the skin daily.   . Insulin Syringe-Needle U-100 (INSULIN SYRINGE 1CC/31GX5/16") 31G X 5/16" 1 ML MISC Use one daily for DM2 E11.9 on insulin  . metolazone (ZAROXOLYN) 2.5 MG tablet Take one tablet by mouth weekly as needed.  . Multiple Vitamins-Minerals (PRESERVISION AREDS PO) Take 1 capsule by mouth every morning.  . nitroGLYCERIN (NITROSTAT) 0.4 MG SL tablet Place 0.4 mg under the tongue every 5 (five) minutes as needed for chest pain.  Marland Kitchen Propylene Glycol (SYSTANE BALANCE OP) Apply 1 drop to eye 2 (two) times daily.  Marland Kitchen torsemide (DEMADEX) 20 MG tablet TAKE TWO TABLETS BY  MOUTH TWICE DAILY *TAKE 1 EXTRA TABLET IN THE AFTERNOON EVERY OTHER DAY*  . [DISCONTINUED] potassium chloride SA (KLOR-CON) 20 MEQ tablet TAKE ONE (1) TABLET BY MOUTH 3 TIMES DAILY (Patient taking differently: Take 20 mEq by mouth 2 (two) times daily.)     Allergies:   Amlodipine besylate, Metformin, Celecoxib, Enalapril maleate, Estrogens, Fluvastatin, Furosemide, Hydrochlorothiazide, Irbesartan, Lisinopril, Losartan, Pantoprazole, Trandolapril, and Valsartan   Social History   Socioeconomic History  . Marital status: Widowed    Spouse name: Not on file  . Number of children: Not on file  . Years of education: Not on file  . Highest education level: Not on file  Occupational History  . Not on file  Tobacco Use  . Smoking status: Never Smoker  . Smokeless tobacco: Never Used  Vaping Use  . Vaping Use: Never used  Substance and Sexual Activity  . Alcohol use: No  . Drug use: No  . Sexual activity: Not  on file  Other Topics Concern  . Not on file  Social History Narrative   ** Merged History Encounter **       Social Determinants of Health   Financial Resource Strain: Not on file  Food Insecurity: Not on file  Transportation Needs: Not on file  Physical Activity: Not on file  Stress: Not on file  Social Connections: Not on file     Family History: The patient's family history includes Diabetes in her sister. ROS:   Please see the history of present illness.    All other systems reviewed and are negative.  EKGs/Labs/Other Studies Reviewed:    The following studies were reviewed today:    Recent Labs: 01/19/2020: Hemoglobin 14.6; Platelets 336 04/22/2020: BUN 23; Creatinine, Ser 1.08; NT-Pro BNP 1,297; Potassium 4.0; Sodium 141  Recent Lipid Panel    Component Value Date/Time   CHOL 250 (H) 01/19/2020 1513   TRIG 371 (H) 01/19/2020 1513   HDL 35 (L) 01/19/2020 1513   CHOLHDL 7.1 (H) 01/19/2020 1513   LDLCALC 146 (H) 01/19/2020 1513    Physical Exam:    VS:  BP (!) 170/68   Pulse 72   Ht 5\' 2"  (1.575 m)   Wt 218 lb (98.9 kg)   SpO2 95%   BMI 39.87 kg/m     Wt Readings from Last 3 Encounters:  07/31/20 218 lb (98.9 kg)  04/22/20 213 lb 9.6 oz (96.9 kg)  01/19/20 211 lb 3.2 oz (95.8 kg)     GEN: She appears her age well nourished, well developed she appears mildly breathless and has end expiratory wheezing HEENT: Normal NECK: No JVD; No carotid bruits LYMPHATICS: No lymphadenopathy CARDIAC: S1 is variable rhythm is irregular 1/6 ejection murmur aortic stenosisRESPIRATORY:  Clear to auscultation without rales, wheezing or rhonchi  ABDOMEN: Soft, non-tender, non-distended MUSCULOSKELETAL:  No edema; No deformity  SKIN: Warm and dry NEUROLOGIC:  Alert and oriented x 3 PSYCHIATRIC:  Normal affect    Signed, 03/21/20, MD  07/31/2020 3:13 PM    [facility]

## 2020-07-31 ENCOUNTER — Encounter: Payer: Self-pay | Admitting: Cardiology

## 2020-07-31 ENCOUNTER — Ambulatory Visit (INDEPENDENT_AMBULATORY_CARE_PROVIDER_SITE_OTHER): Payer: Medicare Other | Admitting: Cardiology

## 2020-07-31 ENCOUNTER — Other Ambulatory Visit: Payer: Self-pay

## 2020-07-31 VITALS — BP 170/68 | HR 72 | Ht 62.0 in | Wt 218.0 lb

## 2020-07-31 DIAGNOSIS — I110 Hypertensive heart disease with heart failure: Secondary | ICD-10-CM

## 2020-07-31 DIAGNOSIS — I4821 Permanent atrial fibrillation: Secondary | ICD-10-CM

## 2020-07-31 DIAGNOSIS — Z7901 Long term (current) use of anticoagulants: Secondary | ICD-10-CM

## 2020-07-31 DIAGNOSIS — I5032 Chronic diastolic (congestive) heart failure: Secondary | ICD-10-CM

## 2020-07-31 DIAGNOSIS — I350 Nonrheumatic aortic (valve) stenosis: Secondary | ICD-10-CM

## 2020-07-31 DIAGNOSIS — I25110 Atherosclerotic heart disease of native coronary artery with unstable angina pectoris: Secondary | ICD-10-CM

## 2020-07-31 MED ORDER — POTASSIUM CHLORIDE CRYS ER 20 MEQ PO TBCR: 20.0000 meq | EXTENDED_RELEASE_TABLET | Freq: Every day | ORAL | 3 refills | Status: AC

## 2020-07-31 NOTE — Patient Instructions (Signed)
Medication Instructions:  Your physician has recommended you make the following change in your medication:  DECREASE: Potassium chloride 20 meq take one tablet by mouth daily.   Please take your metolazone one day every two weeks.  *If you need a refill on your cardiac medications before your next appointment, please call your pharmacy*   Lab Work: None If you have labs (blood work) drawn today and your tests are completely normal, you will receive your results only by: Marland Kitchen MyChart Message (if you have MyChart) OR . A paper copy in the mail If you have any lab test that is abnormal or we need to change your treatment, we will call you to review the results.   Testing/Procedures: None   Follow-Up: At [facility], you and your health needs are our priority.  As part of our continuing mission to provide you with exceptional heart care, we have created designated Provider Care Teams.  These Care Teams include your primary Cardiologist (physician) and Advanced Practice Providers (APPs -  Physician Assistants and Nurse Practitioners) who all work together to provide you with the care you need, when you need it.  We recommend signing up for the patient portal called "MyChart".  Sign up information is provided on this After Visit Summary.  MyChart is used to connect with patients for Virtual Visits (Telemedicine).  Patients are able to view lab/test results, encounter notes, upcoming appointments, etc.  Non-urgent messages can be sent to your provider as well.   To learn more about what you can do with MyChart, go to [email or web address].    Your next appointment:   6 month(s)  The format for your next appointment:   In Person  Provider:   Norman Herrlich, MD   Other Instructions

## 2020-11-09 ENCOUNTER — Other Ambulatory Visit: Payer: Self-pay | Admitting: Cardiology

## 2021-01-30 ENCOUNTER — Other Ambulatory Visit: Payer: Self-pay

## 2021-01-31 ENCOUNTER — Encounter: Payer: Self-pay | Admitting: Cardiology

## 2021-01-31 ENCOUNTER — Ambulatory Visit (INDEPENDENT_AMBULATORY_CARE_PROVIDER_SITE_OTHER): Payer: Medicare Other | Admitting: Cardiology

## 2021-01-31 ENCOUNTER — Other Ambulatory Visit: Payer: Self-pay

## 2021-01-31 VITALS — BP 178/79 | HR 78 | Ht 62.0 in | Wt 214.0 lb

## 2021-01-31 DIAGNOSIS — I25110 Atherosclerotic heart disease of native coronary artery with unstable angina pectoris: Secondary | ICD-10-CM | POA: Diagnosis not present

## 2021-01-31 DIAGNOSIS — I4821 Permanent atrial fibrillation: Secondary | ICD-10-CM

## 2021-01-31 DIAGNOSIS — Z7901 Long term (current) use of anticoagulants: Secondary | ICD-10-CM

## 2021-01-31 DIAGNOSIS — E785 Hyperlipidemia, unspecified: Secondary | ICD-10-CM

## 2021-01-31 DIAGNOSIS — I5032 Chronic diastolic (congestive) heart failure: Secondary | ICD-10-CM

## 2021-01-31 DIAGNOSIS — I110 Hypertensive heart disease with heart failure: Secondary | ICD-10-CM | POA: Diagnosis not present

## 2021-01-31 DIAGNOSIS — I350 Nonrheumatic aortic (valve) stenosis: Secondary | ICD-10-CM

## 2021-01-31 MED ORDER — TORSEMIDE 20 MG PO TABS: 40.0000 mg | ORAL_TABLET | Freq: Two times a day (BID) | ORAL | 3 refills | Status: AC

## 2021-01-31 NOTE — Progress Notes (Signed)
Cardiology Office Note:    Date:  01/31/2021   ID:  Donna Ballard, DOB [birth year], MRN [id number]  PCP:  Everlean Cherry, MD  Cardiologist:  Norman Herrlich, MD    Referring MD: Everlean Cherry, MD    ASSESSMENT:    1. Hypertensive heart disease with chronic diastolic congestive heart failure (HCC)   2. Coronary artery disease involving native coronary artery of native heart with unstable angina pectoris (HCC)   3. Permanent atrial fibrillation (HCC)   4. Chronic anticoagulation   5. Nonrheumatic aortic valve stenosis   6. Hyperlipidemia, unspecified hyperlipidemia type    PLAN:    In order of problems listed above:  Although weight is unchanged she is more short of breath likely from taking her metolazone increase her loop diuretic and should help to control hypertension target.  Check renal function potassium proBNP and continue her beta-blocker.  If another antibody hypertensive agent is needed weight ACE and ARB's tolerant past I would use balanced oral nitrate and hydralazine Stable CAD no anginal discomfort with her comorbidities and overall placement life I would not consider a good candidate for cardiac revascularization Stable rate controlled and she is on Eliquis for stroke prophylaxis.  Check CBC  Stable last echocardiogram Controlled she will not accept lipid-lowering treatment   Next appointment: 6 months   Medication Adjustments/Labs and Tests Ordered: Current medicines are reviewed at length with the patient today.  Concerns regarding medicines are outlined above.  No orders of the defined types were placed in this encounter.  No orders of the defined types were placed in this encounter.   Chief Complaint  Patient presents with   Follow-up   Atrial Fibrillation   Anticoagulation   Congestive Heart Failure   Coronary Artery Disease   Aortic Stenosis     History of Present Illness:    Donna Ballard is a 87 y.o. female with a hx of CAD with previous PCI mild  aortic stenosis hypertensive heart disease with heart failure and hyper lipidemia.  Her echocardiogram in July 2021 showed EF 55 to 60% moderate left atrial enlargement mild mitral regurgitation at that time she had no left ventricular outflow tract gradient.  She was last seen 07/31/2020.  She was seen at her PCP office 11/26/2020 for dysphagia and referred to speech therapy.  She takes high-dose torsemide total of 80 mg daily and metolazone 2-1/2 mg weekly to manage her heart failure.  Compliance with diet, lifestyle and medications: Yes Her daughter is present participates in the evaluation and treatment decisions.  She supervises her mother's medications.  She stopped taking metolazone because of urinary frequency. Her weight is unchanged but she is more breathless in the early morning hours and will increase the dose of her afternoon loop diuretic. No chest pain she has stable edema no palpitations syncope and no bleeding complication of her anticoagulant She has been seen by GI she has had radiographic studies done sounds like a barium swallow still has nausea and intermittent vomiting after meals. Past Medical History:  Diagnosis Date   Acute back pain with sciatica, right 12/17/2019   Acute respiratory failure with hypoxia (HCC) 06/15/2018   Aortic stenosis 07/06/2018   Arthritis    Atrial enlargement, bilateral 07/06/2018   Atrial fibrillation (HCC)    a. dx 05/2018, rate control strategy for now.   Body mass index (BMI) of 40.0-44.9 in adult [facility]) 11/04/2015   CAD in native artery    a. NSTEMI 05/2018 s/p DES to  LAD with residual dz treated medically.   Chronic anticoagulation 07/06/2018   Chronic bronchitis (HCC)    Chronic diastolic CHF (congestive heart failure) (HCC)    Chronic diastolic heart failure (HCC) 06/15/2018   Class 3 severe obesity with serious comorbidity and body mass index (BMI) of 40.0 to 44.9 in adult [facility]) 03/02/2016   Coronary artery disease involving native  coronary artery of native heart with unstable angina pectoris (HCC) 06/15/2018   Coronary artery disease of native artery of native heart with stable angina pectoris (HCC) 06/15/2018   Deep venous insufficiency 07/22/2018   Diabetes mellitus with polyneuropathy (HCC) 07/22/2018   Diabetes mellitus without complication (HCC)    Drug therapy 11/04/2015   Esophageal reflux 07/22/2018   Essential hypertension 03/20/2015   Frailty    Gait instability    Gastritis medicamentosa 04/29/2018   GERD (gastroesophageal reflux disease)    HLD (hyperlipidemia)    HTN (hypertension)    Hyperlipidemia 06/15/2018   Hypertension    Hypertensive heart disease 06/15/2018   Hypertensive heart disease with chronic diastolic congestive heart failure (HCC) 08/09/2018   Hypokalemia    Insulin dependent diabetes mellitus    Leukocytosis 06/15/2018   Mitral regurgitation 07/06/2018   Morbid obesity (HCC) 06/15/2018   Muscle pain    Neuropathy due to medical condition (HCC) 07/22/2018   Normocytic anemia    NSTEMI (non-ST elevated myocardial infarction) (HCC) 06/12/2018   On continuous oral anticoagulation 07/08/2018   Osteoarthritis    Osteopenia 07/22/2018   Pain of both sacroiliac joints 12/30/2018   Persistent atrial fibrillation (HCC) 06/15/2018   Pure hypercholesterolemia 07/22/2018   PVC's (premature ventricular contractions)    Refusal of statin medication by patient 10/13/2019   Swallowing problem 10/13/2019   Swelling    Urge incontinence 07/22/2018   Urinary incontinence 07/22/2018   Visual impairment 07/22/2018   Vitamin D deficiency 07/22/2018    Past Surgical History:  Procedure Laterality Date   ABDOMINAL HYSTERECTOMY     CARDIAC CATHETERIZATION     CHOLECYSTECTOMY     CORONARY ANGIOPLASTY     CORONARY STENT INTERVENTION N/A 06/13/2018   Procedure: CORONARY STENT INTERVENTION;  Surgeon: Yvonne Kendall, MD;  Location: [facility] INVASIVE CV LAB;  Service: Cardiovascular;  Laterality: N/A;   LEFT HEART CATH AND  CORONARY ANGIOGRAPHY N/A 06/13/2018   Procedure: LEFT HEART CATH AND CORONARY ANGIOGRAPHY;  Surgeon: Yvonne Kendall, MD;  Location: [facility] INVASIVE CV LAB;  Service: Cardiovascular;  Laterality: N/A;   parithyroid surgery     09/2013   TONSILLECTOMY      Current Medications: Current Meds  Medication Sig   acetaminophen (TYLENOL) 500 MG tablet Take 500 mg by mouth daily as needed for headache.   apixaban (ELIQUIS) 5 MG TABS tablet Take 5 mg by mouth 2 (two) times daily.   carvedilol (COREG) 25 MG tablet Take 25 mg by mouth 2 (two) times daily with a meal.   cholecalciferol (VITAMIN D3) 25 MCG (1000 UT) tablet Take 1,000 Units by mouth daily.   esomeprazole (NEXIUM) 40 MG capsule Take 40 mg by mouth daily at 12 noon.   fluticasone (FLONASE) 50 MCG/ACT nasal spray Place 1 spray into both nostrils daily.   insulin glargine (LANTUS) 100 UNIT/ML injection Inject 10 Units into the skin daily.    metolazone (ZAROXOLYN) 2.5 MG tablet Take 2.5 mg by mouth once a week.   Multiple Vitamins-Minerals (PRESERVISION AREDS PO) Take 1 capsule by mouth every morning.   nitroGLYCERIN (NITROSTAT) 0.4 MG SL tablet  Place 0.4 mg under the tongue every 5 (five) minutes as needed for chest pain.   ondansetron (ZOFRAN) 4 MG tablet Take 4 mg by mouth every 8 (eight) hours as needed for nausea or vomiting.   potassium chloride SA (KLOR-CON) 20 MEQ tablet Take 1 tablet (20 mEq total) by mouth daily.   Propylene Glycol (SYSTANE BALANCE OP) Apply 1 drop to eye 2 (two) times daily.   torsemide (DEMADEX) 20 MG tablet Take 40 mg by mouth 2 (two) times daily. Can take 1 extra tablet in the afternoon every other day.     Allergies:   Amlodipine besylate, Metformin, Celecoxib, Enalapril maleate, Estrogens, Fluvastatin, Furosemide, Hydrochlorothiazide, Irbesartan, Lisinopril, Losartan, Pantoprazole, Trandolapril, and Valsartan   Social History   Socioeconomic History   Marital status: Widowed    Spouse name: Not on file    Number of children: Not on file   Years of education: Not on file   Highest education level: Not on file  Occupational History   Not on file  Tobacco Use   Smoking status: Never   Smokeless tobacco: Never  Vaping Use   Vaping Use: Never used  Substance and Sexual Activity   Alcohol use: No   Drug use: No   Sexual activity: Not on file  Other Topics Concern   Not on file  Social History Narrative   ** Merged History Encounter **       Social Determinants of Health   Financial Resource Strain: Not on file  Food Insecurity: Not on file  Transportation Needs: Not on file  Physical Activity: Not on file  Stress: Not on file  Social Connections: Not on file     Family History: The patient's family history includes Diabetes in her sister. ROS:   Please see the history of present illness.    All other systems reviewed and are negative.  EKGs/Labs/Other Studies Reviewed:    The following studies were reviewed today:   Recent Labs: 04/22/2020: BUN 23; Creatinine, Ser 1.08; NT-Pro BNP 1,297; Potassium 4.0; Sodium 141  Recent Lipid Panel    Component Value Date/Time   CHOL 250 (H) 01/19/2020 1513   TRIG 371 (H) 01/19/2020 1513   HDL 35 (L) 01/19/2020 1513   CHOLHDL 7.1 (H) 01/19/2020 1513   LDLCALC 146 (H) 01/19/2020 1513    Physical Exam:    VS:  BP (!) 178/79 (BP Location: Right Arm, Patient Position: Sitting)   Pulse 78   Ht 5\' 2"  (1.575 m)   Wt 214 lb (97.1 kg)   SpO2 97%   BMI 39.14 kg/m     Wt Readings from Last 3 Encounters:  01/31/21 214 lb (97.1 kg)  07/31/20 218 lb (98.9 kg)  04/22/20 213 lb 9.6 oz (96.9 kg)     GEN: She looks frail well nourished, well developed in no acute distress HEENT: Normal NECK: No JVD; No carotid bruits LYMPHATICS: No lymphadenopathy CARDIAC: Irregular rhythm variable first heart sound , no murmurs, rubs, gallops RESPIRATORY:  Clear to auscultation without rales, wheezing or rhonchi  ABDOMEN: Soft, non-tender,  non-distended MUSCULOSKELETAL:  No edema; No deformity  SKIN: Warm and dry NEUROLOGIC:  Alert and oriented x 3 PSYCHIATRIC:  Normal affect    Signed, 06/22/20, MD  01/31/2021 4:31 PM    [facility]

## 2021-01-31 NOTE — Patient Instructions (Signed)
Medication Instructions:  Your physician has recommended you make the following change in your medication:   Increase your Torsemide to 40 mg twice daily.  *If you need a refill on your cardiac medications before your next appointment, please call your pharmacy*   Lab Work: Your physician recommends that you have a CBC, BMP and ProBNP today in the office.  If you have labs (blood work) drawn today and your tests are completely normal, you will receive your results only by: MyChart Message (if you have MyChart) OR A paper copy in the mail If you have any lab test that is abnormal or we need to change your treatment, we will call you to review the results.   Testing/Procedures: None ordered   Follow-Up: At [facility], you and your health needs are our priority.  As part of our continuing mission to provide you with exceptional heart care, we have created designated Provider Care Teams.  These Care Teams include your primary Cardiologist (physician) and Advanced Practice Providers (APPs -  Physician Assistants and Nurse Practitioners) who all work together to provide you with the care you need, when you need it.  We recommend signing up for the patient portal called "MyChart".  Sign up information is provided on this After Visit Summary.  MyChart is used to connect with patients for Virtual Visits (Telemedicine).  Patients are able to view lab/test results, encounter notes, upcoming appointments, etc.  Non-urgent messages can be sent to your provider as well.   To learn more about what you can do with MyChart, go to [email or web address].    Your next appointment:   6 month(s)  The format for your next appointment:   In Person  Provider:   Norman Herrlich, MD   Other Instructions NA

## 2021-02-01 LAB — CBC WITH DIFFERENTIAL/PLATELET
Basophils Absolute: 0.1 x10E3/uL (ref 0.0–0.2)
Basos: 1 %
EOS (ABSOLUTE): 0.4 x10E3/uL (ref 0.0–0.4)
Eos: 4 %
Hematocrit: 42.5 % (ref 34.0–46.6)
Hemoglobin: 13.5 g/dL (ref 11.1–15.9)
Immature Grans (Abs): 0.2 x10E3/uL — ABNORMAL HIGH (ref 0.0–0.1)
Immature Granulocytes: 2 %
Lymphocytes Absolute: 2.5 x10E3/uL (ref 0.7–3.1)
Lymphs: 22 %
MCH: 26.5 pg — ABNORMAL LOW (ref 26.6–33.0)
MCHC: 31.8 g/dL (ref 31.5–35.7)
MCV: 83 fL (ref 79–97)
Monocytes Absolute: 0.9 x10E3/uL (ref 0.1–0.9)
Monocytes: 8 %
Neutrophils Absolute: 7.7 x10E3/uL — ABNORMAL HIGH (ref 1.4–7.0)
Neutrophils: 63 %
Platelets: 328 x10E3/uL (ref 150–450)
RBC: 5.1 x10E6/uL (ref 3.77–5.28)
RDW: 14.1 % (ref 11.7–15.4)
WBC: 11.8 x10E3/uL — ABNORMAL HIGH (ref 3.4–10.8)

## 2021-02-01 LAB — BASIC METABOLIC PANEL WITH GFR
BUN/Creatinine Ratio: 14 (ref 12–28)
BUN: 11 mg/dL (ref 8–27)
CO2: 21 mmol/L (ref 20–29)
Calcium: 9.3 mg/dL (ref 8.7–10.3)
Chloride: 99 mmol/L (ref 96–106)
Creatinine, Ser: 0.76 mg/dL (ref 0.57–1.00)
Glucose: 164 mg/dL — ABNORMAL HIGH (ref 65–99)
Potassium: 4.1 mmol/L (ref 3.5–5.2)
Sodium: 141 mmol/L (ref 134–144)
eGFR: 75 mL/min/1.73

## 2021-02-01 LAB — PRO B NATRIURETIC PEPTIDE: NT-Pro BNP: 1878 pg/mL — ABNORMAL HIGH (ref 0–738)

## 2021-02-03 ENCOUNTER — Telehealth: Payer: Self-pay

## 2021-02-03 NOTE — Telephone Encounter (Signed)
Pt returning call

## 2021-02-03 NOTE — Telephone Encounter (Signed)
-----   Message from Baldo Daub, MD sent at 02/02/2021  7:19 PM EDT ----- Normal or stable result  I increased her diuretic in the office on Friday no other changes

## 2021-02-03 NOTE — Telephone Encounter (Signed)
Left message on patients voicemail to please return our call.   

## 2021-02-03 NOTE — Telephone Encounter (Signed)
Spoke with patient regarding results and recommendation.  Patient verbalizes understanding and is agreeable to plan of care. Advised patient to call back with any issues or concerns.  

## 2021-04-26 ENCOUNTER — Other Ambulatory Visit: Payer: Self-pay | Admitting: Cardiology

## 2021-04-28 NOTE — Telephone Encounter (Signed)
Prescription refill request for Eliquis received. Indication:AFIB Last office visit:MUNLEY 01/31/21 Scr:0.76 01/31/21 Age: 54F Weight:97.1KG

## 2021-07-01 NOTE — Progress Notes (Signed)
Cardiology Office Note:    Date:  07/02/2021   ID:  Donna Ballard, DOB 06-17-1931, MRN [id number]  PCP:  Maris Berger, MD  Cardiologist:  Shirlee More, MD    Referring MD: Maris Berger, MD    ASSESSMENT:    1. Preoperative cardiovascular examination   2. Chronic anticoagulation   3. Permanent atrial fibrillation ([place])   4. Hypertensive heart disease with chronic diastolic congestive heart failure ([place])   5. Coronary artery disease involving native coronary artery of native heart with unstable angina pectoris ([place])   6. Mixed hyperlipidemia   7. SOB (shortness of breath)    PLAN:    In order of problems listed above:  Procedure is extremely low risk no contraindication and given instructions for management of anticoagulation Rate controlled continue her beta-blocker also needed for hypertension CAD and current anticoagulant The family is not checking blood pressure at home it was very difficult for her to physically get to my office today I am at the med center in [facility] and through the building as opposed to changing blood pressure medicines asked them to get back to checking her at home on a regular basis trend for 2 weeks and send a copy to see if she needs additional antihypertensive therapy.  In today's world office blood pressure is often are not consistent with home.  She will continue her current loop diuretic and beta-blocker.  If additional therapy is needed balanced oral nitrate and hydralazine I think would be preferable Currently not on a statin poor tolerance I gave her prescription to initiate albuterol 2 puffs twice daily and every 4 hours as needed with a spacer further prescriptions through her primary care   Next appointment: 6 months   Medication Adjustments/Labs and Tests Ordered: Current medicines are reviewed at length with the patient today.  Concerns regarding medicines are outlined above.  Orders Placed This Encounter  Procedures   CBC    Comprehensive metabolic panel   Lipid panel   Pro b natriuretic peptide (BNP)   EKG 12-Lead   Meds ordered this encounter  Medications   albuterol (VENTOLIN HFA) 108 (90 Base) MCG/ACT inhaler    Sig: Inhale 2 puffs into the lungs every 4 (four) hours as needed for wheezing or shortness of breath.    Dispense:  8 g    Refill:  2   Chief complaint: Follow-up atrial fibrillation anticoagulation heart failure, also... Request for Surgical Clearance   Procedure:  Dental Extraction - Amount of Teeth to be Pulled:  13 Date of Surgery:  Clearance TBD                              Surgeon:  N/A Surgeon's Group or Practice Name:  [facility] Phone number:  [phone] Fax number:  [phone] Type of Clearance Requested:   - Pharmacy:  Hold Apixaban (Eliquis) prior to surgery Type of Anesthesia:  Local   History of Present Illness:    Donna Ballard is a 87 y.o. female with a hx of CAD with PCI and stent LAD 2019 mild aortic stenosis hypertensive heart disease with heart failure and hyperlipidemia echocardiogram July 2021 showed EF 55 to 60% moderate left atrial enlargement mild mitral regurgitation at that time she had no left ventricular outflow tract gradient.  She was  last seen 01/31/2021.  Compliance with diet, lifestyle and medications: Yes  As always a very  complex visit with multiple issues My first concern is upcoming oral surgery from a cardiovascular perspective there is no contraindication she can safely be done as an outpatient in this case to allay their anxieties and from a bleeding perspective she will hold her anticoagulant 4 doses prior to dialysis after. Her next concern is that she has continued shortness of breath and is obviously wheezing in my office today which her daughter thinks is a chronic problem and I will initiate her on a bronchodilator. Her weights are down she has trouble feeding she is seeing GI and still has problems at times with  swallowing and at times vomits after eating.  She declined endoscopy She is not having significant edema she remains intermittently short of breath no chest pain palpitations syncope or bleeding The family request that I draw labs today. Her primary perspective is a problem with vision and her daughter tells me she is seen by ophthalmology and has macular degeneration Past Medical History:  Diagnosis Date   Acute back pain with sciatica, right 12/17/2019   Acute respiratory failure with hypoxia ([place]) 06/15/2018   Aortic stenosis 07/06/2018   Arthritis    Atrial enlargement, bilateral 07/06/2018   Atrial fibrillation ([place])    a. dx 05/2018, rate control strategy for now.   Body mass index (BMI) of 40.0-44.9 in adult [facility]) 11/04/2015   CAD in native artery    a. NSTEMI 05/2018 s/p DES to LAD with residual dz treated medically.   Chronic anticoagulation 07/06/2018   Chronic bronchitis (HCC)    Chronic diastolic CHF (congestive heart failure) (HCC)    Chronic diastolic heart failure ([place]) 06/15/2018   Class 3 severe obesity with serious comorbidity and body mass index (BMI) of 40.0 to 44.9 in adult [facility]) 03/02/2016   Coronary artery disease involving native coronary artery of native heart with unstable angina pectoris ([place]) 06/15/2018   Coronary artery disease of native artery of native heart with stable angina pectoris ([place]) 06/15/2018   Deep venous insufficiency 07/22/2018   Diabetes mellitus with polyneuropathy ([place]) 07/22/2018   Diabetes mellitus without complication ([place])    Drug therapy 11/04/2015   Esophageal reflux 07/22/2018   Essential hypertension 03/20/2015   Frailty    Gait instability    Gastritis medicamentosa 04/29/2018   GERD (gastroesophageal reflux disease)    HLD (hyperlipidemia)    HTN (hypertension)    Hyperlipidemia 06/15/2018   Hypertension    Hypertensive heart disease 06/15/2018   Hypertensive heart disease with chronic diastolic congestive heart failure ([place])  08/09/2018   Hypokalemia    Insulin dependent diabetes mellitus    Leukocytosis 06/15/2018   Mitral regurgitation 07/06/2018   Morbid obesity ([place]) 06/15/2018   Muscle pain    Neuropathy due to medical condition ([place]) 07/22/2018   Normocytic anemia    NSTEMI (non-ST elevated myocardial infarction) ([place]) 06/12/2018   On continuous oral anticoagulation 07/08/2018   Osteoarthritis    Osteopenia 07/22/2018   Pain of both sacroiliac joints 12/30/2018   Persistent atrial fibrillation ([place]) 06/15/2018   Pure hypercholesterolemia 07/22/2018   PVC's (premature ventricular contractions)    Refusal of statin medication by patient 10/13/2019   Swallowing problem 10/13/2019   Swelling    Urge incontinence 07/22/2018   Urinary incontinence 07/22/2018   Visual impairment 07/22/2018   Vitamin D deficiency 07/22/2018    Past Surgical History:  Procedure Laterality Date   ABDOMINAL HYSTERECTOMY     CARDIAC CATHETERIZATION     CHOLECYSTECTOMY  CORONARY ANGIOPLASTY     CORONARY STENT INTERVENTION N/A 06/13/2018   Procedure: CORONARY STENT INTERVENTION;  Surgeon: Yvonne Kendall, MD;  Location: [facility] INVASIVE CV LAB;  Service: Cardiovascular;  Laterality: N/A;   LEFT HEART CATH AND CORONARY ANGIOGRAPHY N/A 06/13/2018   Procedure: LEFT HEART CATH AND CORONARY ANGIOGRAPHY;  Surgeon: Yvonne Kendall, MD;  Location: [facility] INVASIVE CV LAB;  Service: Cardiovascular;  Laterality: N/A;   parithyroid surgery     09/2013   TONSILLECTOMY      Current Medications: Current Meds  Medication Sig   acetaminophen (TYLENOL) 500 MG tablet Take 500 mg by mouth daily as needed for headache.   albuterol (VENTOLIN HFA) 108 (90 Base) MCG/ACT inhaler Inhale 2 puffs into the lungs every 4 (four) hours as needed for wheezing or shortness of breath.   apixaban (ELIQUIS) 5 MG TABS tablet TAKE 1 TABLET BY MOUTH TWICE DAILY.   carvedilol (COREG) 25 MG tablet Take 25 mg by mouth 2 (two) times daily with a meal.   cholecalciferol (VITAMIN  D3) 25 MCG (1000 UT) tablet Take 1,000 Units by mouth daily.   esomeprazole (NEXIUM) 40 MG capsule Take 40 mg by mouth daily at 12 noon.   fluticasone (FLONASE) 50 MCG/ACT nasal spray Place 1 spray into both nostrils daily.   insulin glargine (LANTUS) 100 UNIT/ML injection Inject 10 Units into the skin daily.    Multiple Vitamins-Minerals (PRESERVISION AREDS PO) Take 1 capsule by mouth every morning.   nitroGLYCERIN (NITROSTAT) 0.4 MG SL tablet Place 0.4 mg under the tongue every 5 (five) minutes as needed for chest pain.   ondansetron (ZOFRAN) 4 MG tablet Take 4 mg by mouth every 8 (eight) hours as needed for nausea or vomiting.   potassium chloride SA (KLOR-CON) 20 MEQ tablet Take 1 tablet (20 mEq total) by mouth daily.   Propylene Glycol (SYSTANE BALANCE OP) Apply 1 drop to eye 2 (two) times daily.   torsemide (DEMADEX) 20 MG tablet Take 2 tablets (40 mg total) by mouth 2 (two) times daily. Can take 1 extra tablet in the afternoon every other day.     Allergies:   Amlodipine besylate, Metformin, Celecoxib, Enalapril maleate, Estrogens, Fluvastatin, Furosemide, Hydrochlorothiazide, Irbesartan, Lisinopril, Losartan, Pantoprazole, Trandolapril, and Valsartan   Social History   Socioeconomic History   Marital status: Widowed    Spouse name: Not on file   Number of children: Not on file   Years of education: Not on file   Highest education level: Not on file  Occupational History   Not on file  Tobacco Use   Smoking status: Never    Passive exposure: Never   Smokeless tobacco: Never  Vaping Use   Vaping Use: Never used  Substance and Sexual Activity   Alcohol use: No   Drug use: No   Sexual activity: Not on file  Other Topics Concern   Not on file  Social History Narrative   ** Merged History Encounter **       Social Determinants of Health   Financial Resource Strain: Not on file  Food Insecurity: Not on file  Transportation Needs: Not on file  Physical Activity: Not on  file  Stress: Not on file  Social Connections: Not on file     Family History: The patient's family history includes Diabetes in her sister. ROS:   Please see the history of present illness.    All other systems reviewed and are negative.  EKGs/Labs/Other Studies Reviewed:    The following studies  were reviewed today:  EKG:  EKG ordered today and personally reviewed.  The ekg ordered today demonstrates controlled ventricular rate I gave her a copy to hand carry to the oral surgeons office  Recent Labs: 01/31/2021: BUN 11; Creatinine, Ser 0.76; Hemoglobin 13.5; NT-Pro BNP 1,878; Platelets 328; Potassium 4.1; Sodium 141  Recent Lipid Panel    Component Value Date/Time   CHOL 250 (H) 01/19/2020 1513   TRIG 371 (H) 01/19/2020 1513   HDL 35 (L) 01/19/2020 1513   CHOLHDL 7.1 (H) 01/19/2020 1513   LDLCALC 146 (H) 01/19/2020 1513    Physical Exam:    VS:  BP (!) 178/77    Pulse 70    Ht (P) 5\' 2"  (1.575 m)    Wt 208 lb (94.3 kg)    SpO2 96%    BMI (P) 38.04 kg/m     Wt Readings from Last 3 Encounters:  07/02/21 208 lb (94.3 kg)  01/31/21 214 lb (97.1 kg)  07/31/20 218 lb (98.9 kg)     GEN: She looks chronically ill somewhat debilitated well nourished, well developed having audible wheezing without a stethoscope  HEENT: Normal NECK: No JVD; No carotid bruits LYMPHATICS: No lymphadenopathy CARDIAC: Irregular rate and rhythm  no murmurs, rubs, gallops RESPIRATORY: Diffuse upper and lower airway expiratory wheezing ABDOMEN: Soft, non-tender, non-distended MUSCULOSKELETAL: 1+ bilateral edema; No deformity  SKIN: Warm and dry NEUROLOGIC:  Alert and oriented x 3 PSYCHIATRIC:  Normal affect    Signed, Shirlee More, MD  07/02/2021 11:55 AM    [place]

## 2021-07-02 ENCOUNTER — Ambulatory Visit (INDEPENDENT_AMBULATORY_CARE_PROVIDER_SITE_OTHER): Payer: Medicare Other | Admitting: Cardiology

## 2021-07-02 ENCOUNTER — Telehealth: Payer: Self-pay

## 2021-07-02 ENCOUNTER — Encounter: Payer: Self-pay | Admitting: Cardiology

## 2021-07-02 ENCOUNTER — Other Ambulatory Visit: Payer: Self-pay

## 2021-07-02 VITALS — BP 178/77 | HR 70 | Wt 208.0 lb

## 2021-07-02 DIAGNOSIS — R0602 Shortness of breath: Secondary | ICD-10-CM

## 2021-07-02 DIAGNOSIS — E782 Mixed hyperlipidemia: Secondary | ICD-10-CM

## 2021-07-02 DIAGNOSIS — I110 Hypertensive heart disease with heart failure: Secondary | ICD-10-CM

## 2021-07-02 DIAGNOSIS — Z7901 Long term (current) use of anticoagulants: Secondary | ICD-10-CM | POA: Diagnosis not present

## 2021-07-02 DIAGNOSIS — I5032 Chronic diastolic (congestive) heart failure: Secondary | ICD-10-CM

## 2021-07-02 DIAGNOSIS — Z01810 Encounter for preprocedural cardiovascular examination: Secondary | ICD-10-CM

## 2021-07-02 DIAGNOSIS — I4821 Permanent atrial fibrillation: Secondary | ICD-10-CM | POA: Diagnosis not present

## 2021-07-02 DIAGNOSIS — I25110 Atherosclerotic heart disease of native coronary artery with unstable angina pectoris: Secondary | ICD-10-CM

## 2021-07-02 MED ORDER — ALBUTEROL SULFATE HFA 108 (90 BASE) MCG/ACT IN AERS: 2.0000 | INHALATION_SPRAY | RESPIRATORY_TRACT | 2 refills | Status: AC | PRN

## 2021-07-02 NOTE — Telephone Encounter (Signed)
° °  Pre-operative Risk Assessment    Patient Name: Bekki Tavenner  DOB: 1930-09-13 MRN: [id number]     Request for Surgical Clearance    Procedure:  Dental Extraction - Amount of Teeth to be Pulled:  13  Date of Surgery:  Clearance TBD                                 Surgeon:  N/A Surgeon's Group or Practice Name:  [facility] Phone number:  [phone] Fax number:  [phone]   Type of Clearance Requested:   - Pharmacy:  Hold Apixaban (Eliquis) prior to surgery   Type of Anesthesia:  Local    Additional requests/questions:  None  Signed, Dione Housekeeper   07/02/2021, 9:14 AM

## 2021-07-02 NOTE — Telephone Encounter (Signed)
° °  Patient Name: Donna Ballard  DOB: 1930/12/05 MRN: [id number]  Primary Cardiologist: Norman Herrlich, MD  Chart reviewed as part of pre-operative protocol coverage. Per Dr. Hulen Shouts reply in secure chat correspondence, "From my perspective oral surgery is such a low risk procedure but there is no contraindication, I told the family I think is reasonable to hold her anticoagulant 4 doses before 2 doses after although in general they are surgeons are extremely skilled at handling mucosal bleeding. I have sent a copy of my note to the oral surgeons office." SBE ppx was reviewed by Azalee Course PA-C as below and not felt to be required for patient. I do not yet see a routing history in Epic so will bundle this message and today's OV to requesting provider via Epic fax function. Please call with questions.   Laurann Montana, PA-C 07/02/2021, 12:26 PM

## 2021-07-02 NOTE — Patient Instructions (Addendum)
Medication Instructions:  Your physician has recommended you make the following change in your medication:  START: Albuterol 2 puff inhaler every 4 hours as needed.  *If you need a refill on your cardiac medications before your next appointment, please call your pharmacy*   Lab Work: Your physician recommends that you return for lab work in: TODAY CMP, CBC, ProBNP, Lipids If you have labs (blood work) drawn today and your tests are completely normal, you will receive your results only by: MyChart Message (if you have MyChart) OR A paper copy in the mail If you have any lab test that is abnormal or we need to change your treatment, we will call you to review the results.   Testing/Procedures: None   Follow-Up: At [facility], you and your health needs are our priority.  As part of our continuing mission to provide you with exceptional heart care, we have created designated Provider Care Teams.  These Care Teams include your primary Cardiologist (physician) and Advanced Practice Providers (APPs -  Physician Assistants and Nurse Practitioners) who all work together to provide you with the care you need, when you need it.  We recommend signing up for the patient portal called "MyChart".  Sign up information is provided on this After Visit Summary.  MyChart is used to connect with patients for Virtual Visits (Telemedicine).  Patients are able to view lab/test results, encounter notes, upcoming appointments, etc.  Non-urgent messages can be sent to your provider as well.   To learn more about what you can do with MyChart, go to [email or web address].    Your next appointment:   6 month(s)  The format for your next appointment:   In Person  Provider:   Norman Herrlich, MD    Other Instructions Please hold your Eliquis 4 doses prior and 2 doses after your oral surgery.

## 2021-07-02 NOTE — Telephone Encounter (Signed)
Patient has a visit with Dr. Dulce Sellar within the next hour, will defer final cardiac clearance to MD.   I have called and verified with the requesting office, 13 teeth extraction will be divided into 2 separate procedures.  The 7 upper teeth will be extracted in one procedure first and that the remainder 6 teeth will be extracted in separate procedure.  Since more than 2 teeth will be extracted during the same procedure, patient will need to hold Eliquis prior to the surgery.  Chart has been reviewed, patient does not appears to have a history of congenital heart issue, endocarditis or valve repair.  Therefore she does not require any SBE prophylaxis.

## 2021-07-03 ENCOUNTER — Telehealth: Payer: Self-pay

## 2021-07-03 LAB — COMPREHENSIVE METABOLIC PANEL
ALT: 9 IU/L (ref 0–32)
AST: 12 IU/L (ref 0–40)
Albumin/Globulin Ratio: 1.5 (ref 1.2–2.2)
Albumin: 3.7 g/dL (ref 3.6–4.6)
Alkaline Phosphatase: 94 IU/L (ref 44–121)
BUN/Creatinine Ratio: 11 — ABNORMAL LOW (ref 12–28)
BUN: 9 mg/dL (ref 8–27)
Bilirubin Total: 0.5 mg/dL (ref 0.0–1.2)
CO2: 25 mmol/L (ref 20–29)
Calcium: 8.7 mg/dL (ref 8.7–10.3)
Chloride: 97 mmol/L (ref 96–106)
Creatinine, Ser: 0.8 mg/dL (ref 0.57–1.00)
Globulin, Total: 2.5 g/dL (ref 1.5–4.5)
Glucose: 204 mg/dL — ABNORMAL HIGH (ref 70–99)
Potassium: 3.9 mmol/L (ref 3.5–5.2)
Sodium: 141 mmol/L (ref 134–144)
Total Protein: 6.2 g/dL (ref 6.0–8.5)
eGFR: 71 mL/min/{1.73_m2} (ref >59)

## 2021-07-03 LAB — CBC
Hematocrit: 38.3 % (ref 34.0–46.6)
Hemoglobin: 12 g/dL (ref 11.1–15.9)
MCH: 27 pg (ref 26.6–33.0)
MCHC: 31.3 g/dL — ABNORMAL LOW (ref 31.5–35.7)
MCV: 86 fL (ref 79–97)
Platelets: 298 10*3/uL (ref 150–450)
RBC: 4.44 x10E6/uL (ref 3.77–5.28)
RDW: 13.4 % (ref 11.7–15.4)
WBC: 12.1 10*3/uL — ABNORMAL HIGH (ref 3.4–10.8)

## 2021-07-03 LAB — LIPID PANEL
Chol/HDL Ratio: 6.7 ratio — ABNORMAL HIGH (ref 0.0–4.4)
Cholesterol, Total: 200 mg/dL — ABNORMAL HIGH (ref 100–199)
HDL: 30 mg/dL — ABNORMAL LOW (ref >39)
LDL Chol Calc (NIH): 131 mg/dL — ABNORMAL HIGH (ref 0–99)
Triglycerides: 214 mg/dL — ABNORMAL HIGH (ref 0–149)
VLDL Cholesterol Cal: 39 mg/dL (ref 5–40)

## 2021-07-03 LAB — PRO B NATRIURETIC PEPTIDE: NT-Pro BNP: 3055 pg/mL — ABNORMAL HIGH (ref 0–738)

## 2021-07-03 NOTE — Telephone Encounter (Signed)
Spoke with patient regarding results and recommendation.  Patient verbalizes understanding and is agreeable to plan of care. Advised patient to call back with any issues or concerns.  

## 2021-07-03 NOTE — Telephone Encounter (Signed)
-----   Message from Baldo Daub, MD sent at 07/03/2021  7:36 AM EST ----- Stable no changes

## 2021-07-22 ENCOUNTER — Ambulatory Visit: Payer: Medicare Other | Admitting: Cardiology

## 2021-12-11 DIAGNOSIS — I340 Nonrheumatic mitral (valve) insufficiency: Secondary | ICD-10-CM | POA: Diagnosis not present

## 2021-12-31 ENCOUNTER — Ambulatory Visit: Payer: Medicare Other | Admitting: Cardiology

## 2022-06-19 DEATH — deceased
